# Patient Record
Sex: Female | Born: 1980
Health system: Southern US, Community
[De-identification: ages and names within clinical notes are randomized; demographics above are authoritative.]

## PROBLEM LIST (undated history)

## (undated) ENCOUNTER — Ambulatory Visit

## (undated) ENCOUNTER — Encounter

## (undated) ENCOUNTER — Ambulatory Visit: Payer: PRIVATE HEALTH INSURANCE | Attending: Physician Assistant | Primary: Physician Assistant

## (undated) ENCOUNTER — Ambulatory Visit: Payer: Medicaid (Managed Care) | Attending: Medical | Primary: Medical

## (undated) ENCOUNTER — Encounter: Attending: Podiatrist | Primary: Podiatrist

## (undated) ENCOUNTER — Ambulatory Visit: Attending: Pharmacist | Primary: Pharmacist

## (undated) ENCOUNTER — Telehealth

## (undated) ENCOUNTER — Ambulatory Visit: Payer: PRIVATE HEALTH INSURANCE | Attending: Hematology & Oncology | Primary: Hematology & Oncology

## (undated) ENCOUNTER — Encounter: Payer: PRIVATE HEALTH INSURANCE | Attending: Women's Health | Primary: Women's Health

## (undated) ENCOUNTER — Ambulatory Visit: Payer: Medicaid (Managed Care) | Attending: Critical Care Medicine | Primary: Critical Care Medicine

## (undated) ENCOUNTER — Telehealth: Attending: Medical | Primary: Medical

## (undated) ENCOUNTER — Ambulatory Visit
Payer: PRIVATE HEALTH INSURANCE | Attending: Physical Medicine & Rehabilitation | Primary: Physical Medicine & Rehabilitation

## (undated) ENCOUNTER — Ambulatory Visit: Payer: PRIVATE HEALTH INSURANCE

## (undated) ENCOUNTER — Encounter: Attending: Women's Health | Primary: Women's Health

## (undated) ENCOUNTER — Ambulatory Visit: Payer: Medicaid (Managed Care) | Attending: Family | Primary: Family

## (undated) ENCOUNTER — Ambulatory Visit: Payer: Medicaid (Managed Care) | Attending: Family Medicine | Primary: Family Medicine

## (undated) ENCOUNTER — Encounter: Attending: Family | Primary: Family

## (undated) ENCOUNTER — Ambulatory Visit: Payer: Medicaid (Managed Care) | Attending: Podiatrist | Primary: Podiatrist

## (undated) ENCOUNTER — Encounter: Attending: Physician Assistant | Primary: Physician Assistant

## (undated) ENCOUNTER — Telehealth: Attending: Family | Primary: Family

## (undated) ENCOUNTER — Encounter: Attending: Medical | Primary: Medical

## (undated) ENCOUNTER — Encounter
Attending: Student in an Organized Health Care Education/Training Program | Primary: Student in an Organized Health Care Education/Training Program

## (undated) ENCOUNTER — Ambulatory Visit: Payer: Medicaid (Managed Care)

## (undated) ENCOUNTER — Encounter: Attending: Internal Medicine | Primary: Internal Medicine

## (undated) ENCOUNTER — Ambulatory Visit: Payer: PRIVATE HEALTH INSURANCE | Attending: Internal Medicine | Primary: Internal Medicine

## (undated) ENCOUNTER — Telehealth: Payer: PRIVATE HEALTH INSURANCE

## (undated) ENCOUNTER — Encounter
Payer: Medicaid (Managed Care) | Attending: Student in an Organized Health Care Education/Training Program | Primary: Student in an Organized Health Care Education/Training Program

## (undated) ENCOUNTER — Telehealth: Attending: Physician Assistant | Primary: Physician Assistant

## (undated) ENCOUNTER — Encounter: Attending: Physical Medicine & Rehabilitation | Primary: Physical Medicine & Rehabilitation

## (undated) ENCOUNTER — Ambulatory Visit: Payer: Medicaid (Managed Care) | Attending: Internal Medicine | Primary: Internal Medicine

## (undated) ENCOUNTER — Telehealth: Attending: Obstetrics & Gynecology | Primary: Obstetrics & Gynecology

## (undated) ENCOUNTER — Non-Acute Institutional Stay: Payer: PRIVATE HEALTH INSURANCE | Attending: Podiatrist | Primary: Podiatrist

## (undated) ENCOUNTER — Ambulatory Visit: Payer: Medicaid (Managed Care) | Attending: Physician Assistant | Primary: Physician Assistant

## (undated) DIAGNOSIS — E119 Type 2 diabetes mellitus without complications: Secondary | ICD-10-CM

## (undated) DIAGNOSIS — J40 Bronchitis, not specified as acute or chronic: Secondary | ICD-10-CM

## (undated) DIAGNOSIS — G4730 Sleep apnea, unspecified: Secondary | ICD-10-CM

## (undated) DIAGNOSIS — E162 Hypoglycemia, unspecified: Secondary | ICD-10-CM

## (undated) DIAGNOSIS — J45909 Unspecified asthma, uncomplicated: Secondary | ICD-10-CM

## (undated) DIAGNOSIS — K219 Gastro-esophageal reflux disease without esophagitis: Secondary | ICD-10-CM

## (undated) DIAGNOSIS — R0600 Dyspnea, unspecified: Secondary | ICD-10-CM

## (undated) DIAGNOSIS — R0789 Other chest pain: Secondary | ICD-10-CM

## (undated) HISTORY — PX: WISDOM TOOTH EXTRACTION: SHX21

## (undated) HISTORY — PX: CHOLECYSTECTOMY: SHX55

## (undated) HISTORY — PX: CARPAL TUNNEL RELEASE: SHX101

---
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
## 2001-06-13 ENCOUNTER — Encounter: Payer: Self-pay | Admitting: Obstetrics & Gynecology

## 2001-06-13 ENCOUNTER — Inpatient Hospital Stay (HOSPITAL_COMMUNITY): Admission: AD | Admit: 2001-06-13 | Discharge: 2001-06-13 | Payer: Self-pay | Admitting: Obstetrics & Gynecology

## 2001-06-17 ENCOUNTER — Inpatient Hospital Stay (HOSPITAL_COMMUNITY): Admission: AD | Admit: 2001-06-17 | Discharge: 2001-06-17 | Payer: Self-pay | Admitting: Obstetrics & Gynecology

## 2001-08-11 HISTORY — PX: CHOLECYSTECTOMY: SHX55

## 2001-08-20 ENCOUNTER — Emergency Department (HOSPITAL_COMMUNITY): Admission: EM | Admit: 2001-08-20 | Discharge: 2001-08-21 | Payer: Self-pay | Admitting: Emergency Medicine

## 2001-08-20 ENCOUNTER — Encounter: Payer: Self-pay | Admitting: Emergency Medicine

## 2006-06-05 ENCOUNTER — Observation Stay: Payer: Self-pay

## 2006-11-01 ENCOUNTER — Emergency Department: Payer: Self-pay | Admitting: Emergency Medicine

## 2007-06-16 ENCOUNTER — Observation Stay: Payer: Self-pay

## 2007-10-16 ENCOUNTER — Emergency Department: Payer: Self-pay | Admitting: Emergency Medicine

## 2007-12-02 ENCOUNTER — Emergency Department: Payer: Self-pay | Admitting: Emergency Medicine

## 2008-02-15 ENCOUNTER — Emergency Department: Payer: Self-pay | Admitting: Emergency Medicine

## 2008-03-11 ENCOUNTER — Emergency Department: Payer: Self-pay | Admitting: Emergency Medicine

## 2008-08-26 ENCOUNTER — Emergency Department: Payer: Self-pay

## 2008-09-12 ENCOUNTER — Emergency Department: Payer: Self-pay | Admitting: Emergency Medicine

## 2009-03-11 ENCOUNTER — Emergency Department: Payer: Self-pay | Admitting: Emergency Medicine

## 2009-04-02 ENCOUNTER — Emergency Department: Payer: Self-pay | Admitting: Emergency Medicine

## 2009-08-05 ENCOUNTER — Observation Stay: Payer: Self-pay | Admitting: Obstetrics and Gynecology

## 2009-08-27 ENCOUNTER — Encounter: Payer: Self-pay | Admitting: Family Medicine

## 2009-09-11 ENCOUNTER — Encounter: Payer: Self-pay | Admitting: Family Medicine

## 2009-10-09 ENCOUNTER — Encounter: Payer: Self-pay | Admitting: Family Medicine

## 2009-11-12 ENCOUNTER — Ambulatory Visit: Payer: Self-pay | Admitting: Pediatrics

## 2009-12-14 ENCOUNTER — Ambulatory Visit: Payer: Self-pay | Admitting: Family Medicine

## 2010-02-19 ENCOUNTER — Ambulatory Visit: Payer: Self-pay | Admitting: Family Medicine

## 2011-06-20 ENCOUNTER — Emergency Department: Payer: Self-pay | Admitting: Emergency Medicine

## 2011-07-17 ENCOUNTER — Emergency Department: Payer: Self-pay | Admitting: Emergency Medicine

## 2011-11-03 ENCOUNTER — Emergency Department: Payer: Self-pay

## 2011-11-03 LAB — URINALYSIS, COMPLETE
Glucose,UR: NEGATIVE mg/dL (ref 0–75)
Leukocyte Esterase: NEGATIVE
Nitrite: NEGATIVE
Ph: 6 (ref 4.5–8.0)
Protein: 30
RBC,UR: 8 /HPF (ref 0–5)
Specific Gravity: 1.035 (ref 1.003–1.030)
Squamous Epithelial: 2
WBC UR: 1 /HPF (ref 0–5)

## 2011-11-03 LAB — PREGNANCY, URINE: Pregnancy Test, Urine: NEGATIVE m[IU]/mL

## 2012-08-08 ENCOUNTER — Emergency Department: Payer: Self-pay | Admitting: Internal Medicine

## 2012-09-27 ENCOUNTER — Emergency Department: Payer: Self-pay | Admitting: Emergency Medicine

## 2012-09-27 LAB — URINALYSIS, COMPLETE
Bilirubin,UR: NEGATIVE
Nitrite: NEGATIVE
Ph: 5 (ref 4.5–8.0)
Protein: NEGATIVE
RBC,UR: 9 /HPF (ref 0–5)
Squamous Epithelial: 10
WBC UR: 18 /HPF (ref 0–5)

## 2012-12-12 ENCOUNTER — Emergency Department: Payer: Self-pay | Admitting: Internal Medicine

## 2012-12-12 LAB — COMPREHENSIVE METABOLIC PANEL
Alkaline Phosphatase: 94 U/L (ref 50–136)
Anion Gap: 7 (ref 7–16)
BUN: 11 mg/dL (ref 7–18)
Calcium, Total: 9 mg/dL (ref 8.5–10.1)
Creatinine: 0.78 mg/dL (ref 0.60–1.30)
EGFR (African American): >60
EGFR (Non-African Amer.): >60
Glucose: 128 mg/dL — ABNORMAL HIGH (ref 65–99)
Osmolality: 282 (ref 275–301)
Potassium: 3.8 mmol/L (ref 3.5–5.1)

## 2012-12-12 LAB — URINALYSIS, COMPLETE
Glucose,UR: NEGATIVE mg/dL (ref 0–75)
Nitrite: NEGATIVE
Ph: 5 (ref 4.5–8.0)
Protein: 30
Specific Gravity: 1.047 (ref 1.003–1.030)
WBC UR: 73 /HPF (ref 0–5)

## 2012-12-12 LAB — CBC
HCT: 39.2 % (ref 35.0–47.0)
MCH: 27 pg (ref 26.0–34.0)
MCHC: 33.1 g/dL (ref 32.0–36.0)
Platelet: 289 10*3/uL (ref 150–440)
RBC: 4.82 10*6/uL (ref 3.80–5.20)
WBC: 13.6 10*3/uL — ABNORMAL HIGH (ref 3.6–11.0)

## 2012-12-12 LAB — LIPASE, BLOOD: Lipase: 170 U/L (ref 73–393)

## 2013-02-17 ENCOUNTER — Emergency Department: Payer: Self-pay | Admitting: Emergency Medicine

## 2013-02-17 LAB — URINALYSIS, COMPLETE
Bilirubin,UR: NEGATIVE
Glucose,UR: NEGATIVE mg/dL (ref 0–75)
Ketone: NEGATIVE
Nitrite: NEGATIVE
Ph: 6 (ref 4.5–8.0)
Protein: NEGATIVE
RBC,UR: 16 /HPF (ref 0–5)
Specific Gravity: 1.03 (ref 1.003–1.030)
Squamous Epithelial: 19
WBC UR: 22 /HPF (ref 0–5)

## 2013-04-08 ENCOUNTER — Emergency Department: Payer: Self-pay | Admitting: Emergency Medicine

## 2013-04-08 LAB — URINALYSIS, COMPLETE
Glucose,UR: NEGATIVE mg/dL (ref 0–75)
Nitrite: NEGATIVE
Ph: 5 (ref 4.5–8.0)
Protein: NEGATIVE
RBC,UR: 7 /HPF (ref 0–5)
Specific Gravity: 1.024 (ref 1.003–1.030)
Squamous Epithelial: 15
WBC UR: 12 /HPF (ref 0–5)

## 2013-04-08 LAB — COMPREHENSIVE METABOLIC PANEL
Albumin: 3.2 g/dL — ABNORMAL LOW (ref 3.4–5.0)
Alkaline Phosphatase: 101 U/L (ref 50–136)
Anion Gap: 7 (ref 7–16)
BUN: 8 mg/dL (ref 7–18)
Bilirubin,Total: 0.4 mg/dL (ref 0.2–1.0)
Calcium, Total: 9.5 mg/dL (ref 8.5–10.1)
Chloride: 105 mmol/L (ref 98–107)
Co2: 25 mmol/L (ref 21–32)
Creatinine: 0.68 mg/dL (ref 0.60–1.30)
EGFR (African American): >60
EGFR (Non-African Amer.): >60
Glucose: 78 mg/dL (ref 65–99)
Osmolality: 271 (ref 275–301)
Potassium: 4.1 mmol/L (ref 3.5–5.1)
SGOT(AST): 19 U/L (ref 15–37)
SGPT (ALT): 24 U/L (ref 12–78)
Sodium: 137 mmol/L (ref 136–145)
Total Protein: 7.1 g/dL (ref 6.4–8.2)

## 2013-04-08 LAB — CBC
HCT: 40.2 % (ref 35.0–47.0)
HGB: 13.6 g/dL (ref 12.0–16.0)
MCH: 28.4 pg (ref 26.0–34.0)
MCHC: 33.9 g/dL (ref 32.0–36.0)
MCV: 84 fL (ref 80–100)
Platelet: 263 10*3/uL (ref 150–440)
RBC: 4.8 10*6/uL (ref 3.80–5.20)
RDW: 15.7 % — ABNORMAL HIGH (ref 11.5–14.5)
WBC: 12.4 10*3/uL — ABNORMAL HIGH (ref 3.6–11.0)

## 2013-04-08 LAB — PREGNANCY, URINE: Pregnancy Test, Urine: POSITIVE m[IU]/mL

## 2013-05-22 ENCOUNTER — Emergency Department: Payer: Self-pay | Admitting: Emergency Medicine

## 2013-05-22 LAB — CBC
HCT: 35.2 % (ref 35.0–47.0)
MCH: 28.9 pg (ref 26.0–34.0)
MCHC: 34.4 g/dL (ref 32.0–36.0)
MCV: 84 fL (ref 80–100)
Platelet: 211 10*3/uL (ref 150–440)
RBC: 4.19 10*6/uL (ref 3.80–5.20)
RDW: 15.3 % — ABNORMAL HIGH (ref 11.5–14.5)
WBC: 10.8 10*3/uL (ref 3.6–11.0)

## 2013-05-22 LAB — URINALYSIS, COMPLETE
Bilirubin,UR: NEGATIVE
Ketone: NEGATIVE
Ph: 5 (ref 4.5–8.0)
RBC,UR: 8 /HPF (ref 0–5)
Squamous Epithelial: 3
WBC UR: 10 /HPF (ref 0–5)

## 2013-05-22 LAB — HCG, QUANTITATIVE, PREGNANCY: Beta Hcg, Quant.: 20929 m[IU]/mL — ABNORMAL HIGH

## 2013-05-22 LAB — WET PREP, GENITAL

## 2013-07-14 ENCOUNTER — Emergency Department: Payer: Self-pay | Admitting: Emergency Medicine

## 2013-07-14 LAB — COMPREHENSIVE METABOLIC PANEL
Albumin: 2.5 g/dL — ABNORMAL LOW (ref 3.4–5.0)
Alkaline Phosphatase: 115 U/L
BUN: 6 mg/dL — ABNORMAL LOW (ref 7–18)
Bilirubin,Total: 0.3 mg/dL (ref 0.2–1.0)
Chloride: 108 mmol/L — ABNORMAL HIGH (ref 98–107)
Creatinine: 0.52 mg/dL — ABNORMAL LOW (ref 0.60–1.30)
EGFR (African American): >60
Glucose: 127 mg/dL — ABNORMAL HIGH (ref 65–99)
Osmolality: 271 (ref 275–301)

## 2013-07-14 LAB — URINALYSIS, COMPLETE
Blood: NEGATIVE
Nitrite: NEGATIVE
Protein: 100
Squamous Epithelial: 6
WBC UR: 5 /HPF (ref 0–5)

## 2013-07-14 LAB — CBC
HGB: 12 g/dL (ref 12.0–16.0)
MCH: 28.7 pg (ref 26.0–34.0)
MCV: 84 fL (ref 80–100)
Platelet: 276 10*3/uL (ref 150–440)
RBC: 4.18 10*6/uL (ref 3.80–5.20)
RDW: 14.5 % (ref 11.5–14.5)

## 2013-07-14 LAB — TROPONIN I: Troponin-I: <0.02 ng/mL

## 2014-02-13 ENCOUNTER — Emergency Department: Payer: Self-pay | Admitting: Emergency Medicine

## 2014-08-12 ENCOUNTER — Emergency Department: Payer: Self-pay | Admitting: Emergency Medicine

## 2014-11-29 ENCOUNTER — Emergency Department
Admit: 2014-11-29 | Disposition: A | Discharge status: Home / Self Care | Payer: Self-pay | Admitting: Emergency Medicine

## 2015-01-10 ENCOUNTER — Emergency Department
Admission: EM | Admit: 2015-01-10 | Discharge: 2015-01-10 | Disposition: A | Discharge status: Home / Self Care | Payer: Medicaid Other | Attending: Emergency Medicine | Admitting: Emergency Medicine

## 2015-01-10 ENCOUNTER — Encounter: Payer: Self-pay | Admitting: Emergency Medicine

## 2015-01-10 ENCOUNTER — Emergency Department: Payer: Medicaid Other

## 2015-01-10 DIAGNOSIS — M546 Pain in thoracic spine: Secondary | ICD-10-CM | POA: Diagnosis not present

## 2015-01-10 DIAGNOSIS — J9801 Acute bronchospasm: Secondary | ICD-10-CM

## 2015-01-10 DIAGNOSIS — Z720 Tobacco use: Secondary | ICD-10-CM | POA: Diagnosis not present

## 2015-01-10 DIAGNOSIS — R0602 Shortness of breath: Secondary | ICD-10-CM | POA: Diagnosis present

## 2015-01-10 DIAGNOSIS — J45901 Unspecified asthma with (acute) exacerbation: Secondary | ICD-10-CM | POA: Diagnosis not present

## 2015-01-10 DIAGNOSIS — Z79899 Other long term (current) drug therapy: Secondary | ICD-10-CM | POA: Insufficient documentation

## 2015-01-10 HISTORY — DX: Unspecified asthma, uncomplicated: J45.909

## 2015-01-10 HISTORY — DX: Gastro-esophageal reflux disease without esophagitis: K21.9

## 2015-01-10 HISTORY — DX: Bronchitis, not specified as acute or chronic: J40

## 2015-01-10 MED ORDER — ALBUTEROL SULFATE HFA 108 (90 BASE) MCG/ACT IN AERS: 2.0000 | INHALATION_SPRAY | RESPIRATORY_TRACT | Status: DC | PRN

## 2015-01-10 MED ORDER — DEXAMETHASONE 1 MG/ML PO CONC
ORAL | Status: AC
  Administered 2015-01-10: 10 mg via ORAL
  Filled 2015-01-10: qty 1

## 2015-01-10 MED ORDER — DEXAMETHASONE 4 MG PO TABS
10.0000 mg | ORAL_TABLET | Freq: Once | ORAL | Status: DC
  Filled 2015-01-10: qty 1

## 2015-01-10 MED ORDER — DEXAMETHASONE 1 MG/ML PO CONC
10.0000 mg | Freq: Once | ORAL | Status: AC
  Administered 2015-01-10: 10 mg via ORAL

## 2015-01-10 MED ORDER — DEXAMETHASONE 1 MG/ML PO CONC: 10.0000 mg | Freq: Once | ORAL | Status: DC

## 2015-01-10 NOTE — ED Notes (Signed)
Pt with shortness of breath and upper back pain for one month. States is coughing up green sputum. No resp distress.

## 2015-01-10 NOTE — ED Provider Notes (Signed)
[facility] Emergency Department Provider Note  ____________________________________________  Time seen: 5:10 PM  I have reviewed the triage vital signs and the nursing notes.   HISTORY  Chief Complaint Shortness of Breath    HPI Teresa Malone is a 34 y.o. female who complains of shortness of breath and productive cough for 2 days. No fever or chills, nausea vomiting diarrhea, chest pain. She notes that she has gone 7 years of working and just started working about one month ago and has found that she has diffuse achy joints with the working. She works in a [employer]. 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 She notes that she has requested a face mask to wear while working but has not been provided one by her employer.     Past Medical History  Diagnosis Date  . Asthma   . GERD (gastroesophageal reflux disease)   . Bronchitis     There are no active problems to display for this patient.   Past Surgical History  Procedure Laterality Date  . Cholecystectomy      Current Outpatient Rx  Name  Route  Sig  Dispense  Refill  . ALBUTEROL SULFATE HFA IN   Inhalation   Inhale 1 puff into the lungs 2 (two) times daily as needed (shortness of breath or wheezing).         . cetirizine (ZYRTEC) 10 MG tablet   Oral   Take 10 mg by mouth daily.         Marland Kitchen omeprazole (PRILOSEC) 40 MG capsule   Oral   Take 40 mg by mouth daily.         Marland Kitchen albuterol (PROVENTIL HFA) 108 (90 BASE) MCG/ACT inhaler   Inhalation   Inhale 2 puffs into the lungs every 4 (four) hours as needed for wheezing or shortness of breath.   1 Inhaler   0     Allergies Aspirin; Meloxicam; Morphine and related; and Tramadol  No family history on file.  Social History History  Substance Use Topics  . Smoking status: Current Every Day Smoker  . Smokeless tobacco: Not on file  . Alcohol  Use: No    Review of Systems  Constitutional: No fever or chills. No weight changes Eyes:No blurry vision or double vision.  ENT: No sore throat. Cardiovascular: No chest pain. Respiratory: Shortness of breath and productive cough. Gastrointestinal: Negative for abdominal pain, vomiting and diarrhea.  No BRBPR or melena. Genitourinary: Negative for dysuria, urinary retention, bloody urine, or difficulty urinating. Musculoskeletal: Negative for back pain. No joint swelling or pain. Skin: Negative for rash. Neurological: Negative for headaches, focal weakness or numbness. Psychiatric:No anxiety or depression.   Endocrine:No hot/cold intolerance, changes in energy, or sleep difficulty.  10-point ROS otherwise negative.  ____________________________________________   PHYSICAL EXAM:  VITAL SIGNS: ED Triage Vitals  Enc Vitals Group     BP 01/10/15 1557 121/56 mmHg     Pulse Rate 01/10/15 1557 82     Resp 01/10/15 1557 20     Temp 01/10/15 1557 97.6 F (36.4 C)     Temp Source 01/10/15 1557 Oral     SpO2 01/10/15 1557 99 %     Weight 01/10/15 1557 318 lb (144.244 kg)     Height 01/10/15 1557 5\' 6"  (1.676 m)     Head Cir --      Peak  Flow --      Pain Score 01/10/15 1558 6     Pain Loc --      Pain Edu? --      Excl. in [place]? --      Constitutional: Alert and oriented. Well appearing and in no distress. Eyes: No scleral icterus. No conjunctival pallor. PERRL. EOMI ENT   Head: Normocephalic and atraumatic.   Nose: No congestion/rhinnorhea. No septal hematoma   Mouth/Throat: MMM, no pharyngeal erythema. No peritonsillar mass. No uvula shift.   Neck: No stridor. No SubQ emphysema. No meningismus. Hematological/Lymphatic/Immunilogical: No cervical lymphadenopathy. Cardiovascular: RRR. Normal and symmetric distal pulses are present in all extremities. No murmurs, rubs, or gallops. Respiratory: Normal respiratory effort without tachypnea nor retractions. Breath  sounds are clear and equal bilaterally. No wheezes/rales/rhonchi. With forced expiration or forceful cough, there is wheezing on auscultation Gastrointestinal: Soft and nontender. No distention. There is no CVA tenderness.  No rebound, rigidity, or guarding. Genitourinary: deferred Musculoskeletal: Nontender with normal range of motion in all extremities. No joint effusions.  No lower extremity tenderness.  No edema. Neurologic:   Normal speech and language.  CN 2-10 normal. Motor grossly intact. No pronator drift.  Normal gait. No gross focal neurologic deficits are appreciated.  Skin:  Skin is warm, dry and intact. No rash noted.  No petechiae, purpura, or bullae. Psychiatric: Mood and affect are normal. Speech and behavior are normal. Patient exhibits appropriate insight and judgment.  ____________________________________________    LABS (pertinent positives/negatives) (all labs ordered are listed, but only abnormal results are displayed) Labs Reviewed - No data to display ____________________________________________   EKG    ____________________________________________    RADIOLOGY  Chest x-ray unremarkable  ____________________________________________   PROCEDURES  ____________________________________________   INITIAL IMPRESSION / ASSESSMENT AND PLAN / ED COURSE  Pertinent labs & imaging results that were available during my care of the patient were reviewed by me and considered in my medical decision making (see chart for details).  Patient presents with acute bronchospasm in the setting of a history of asthma, most likely related to new environmental exposure in her job. I did advise her that wearing a facemask or working will probably be helpful and protecting her from the symptoms. We'll give her a one-time dose of Decadron here as well as a new prescription for albuterol she is recently run out. No evidence of pneumonia or sepsis, PE TAD carditis ACS  mediastinitis or pneumothorax. We'll discharge her home in good condition to follow up with her primary care doctor  ____________________________________________   FINAL CLINICAL IMPRESSION(S) / ED DIAGNOSES  Final diagnoses:  Bronchospasm, acute      Carrie Mew, MD 01/10/15 1745

## 2015-01-10 NOTE — Discharge Instructions (Signed)
Bronchospasm °A bronchospasm is a spasm or tightening of the airways going into the lungs. During a bronchospasm breathing becomes more difficult because the airways get smaller. When this happens there can be coughing, a whistling sound when breathing (wheezing), and difficulty breathing. Bronchospasm is often associated with asthma, but not all patients who experience a bronchospasm have asthma. °CAUSES  °A bronchospasm is caused by inflammation or irritation of the airways. The inflammation or irritation may be triggered by:  °· Allergies (such as to animals, pollen, food, or mold). Allergens that cause bronchospasm may cause wheezing immediately after exposure or many hours later.   °· Infection. Viral infections are believed to be the most common cause of bronchospasm.   °· Exercise.   °· Irritants (such as pollution, cigarette smoke, strong odors, aerosol sprays, and paint fumes).   °· Weather changes. Winds increase molds and pollens in the air. Rain refreshes the air by washing irritants out. Cold air may cause inflammation.   °· Stress and emotional upset.   °SIGNS AND SYMPTOMS  °· Wheezing.   °· Excessive nighttime coughing.   °· Frequent or severe coughing with a simple cold.   °· Chest tightness.   °· Shortness of breath.   °DIAGNOSIS  °Bronchospasm is usually diagnosed through a history and physical exam. Tests, such as chest X-rays, are sometimes done to look for other conditions. °TREATMENT  °· Inhaled medicines can be given to open up your airways and help you breathe. The medicines can be given using either an inhaler or a nebulizer machine. °· Corticosteroid medicines may be given for severe bronchospasm, usually when it is associated with asthma. °HOME CARE INSTRUCTIONS  °· Always have a plan prepared for seeking medical care. Know when to call your health care provider and local emergency services (911 in the [place] Know where you can access local emergency care. °· Only take medicines as  directed by your health care provider. °· If you were prescribed an inhaler or nebulizer machine, ask your health care provider to explain how to use it correctly. Always use a spacer with your inhaler if you were given one. °· It is necessary to remain calm during an attack. Try to relax and breathe more slowly.  °· Control your home environment in the following ways:   °¨ Change your heating and air conditioning filter at least once a month.   °¨ Limit your use of fireplaces and wood stoves. °¨ Do not smoke and do not allow smoking in your home.   °¨ Avoid exposure to perfumes and fragrances.   °¨ Get rid of pests (such as roaches and mice) and their droppings.   °¨ Throw away plants if you see mold on them.   °¨ Keep your house clean and dust free.   °¨ Replace carpet with wood, tile, or vinyl flooring. Carpet can trap dander and dust.   °¨ Use allergy-proof pillows, mattress covers, and box spring covers.   °¨ Wash bed sheets and blankets every week in hot water and dry them in a dryer.   °¨ Use blankets that are made of polyester or cotton.   °¨ Wash hands frequently. °SEEK MEDICAL CARE IF:  °· You have muscle aches.   °· You have chest pain.   °· The sputum changes from clear or white to yellow, green, gray, or bloody.   °· The sputum you cough up gets thicker.   °· There are problems that may be related to the medicine you are given, such as a rash, itching, swelling, or trouble breathing.   °SEEK IMMEDIATE MEDICAL CARE IF:  °· You have worsening wheezing and coughing even   after taking your prescribed medicines.   °· You have increased difficulty breathing.   °· You develop severe chest pain. °MAKE SURE YOU:  °· Understand these instructions. °· Will watch your condition. °· Will get help right away if you are not doing well or get worse. °Document Released: 07/31/2003 Document Revised: 08/02/2013 Document Reviewed: 01/17/2013 °[employer]® Patient Information ©2015 [employer]. This information is not  intended to replace advice given to you by your health care provider. Make sure you discuss any questions you have with your health care provider. ° °

## 2015-01-10 NOTE — ED Notes (Signed)
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 and c/o low sternal chest wall pain radiating around to the back. Patient alert and oriented, not complaining of any other system

## 2015-01-21 DIAGNOSIS — Z720 Tobacco use: Secondary | ICD-10-CM | POA: Insufficient documentation

## 2015-01-21 DIAGNOSIS — J45901 Unspecified asthma with (acute) exacerbation: Secondary | ICD-10-CM | POA: Insufficient documentation

## 2015-01-21 DIAGNOSIS — J0140 Acute pansinusitis, unspecified: Secondary | ICD-10-CM | POA: Insufficient documentation

## 2015-01-21 DIAGNOSIS — H9203 Otalgia, bilateral: Secondary | ICD-10-CM | POA: Insufficient documentation

## 2015-01-21 DIAGNOSIS — Z79899 Other long term (current) drug therapy: Secondary | ICD-10-CM | POA: Diagnosis not present

## 2015-01-21 NOTE — ED Notes (Signed)
Pt states that she went swimming yesterday and didn't wear earplugs, pt states that she started with ear pain today bilat and ran a fever earlier

## 2015-01-22 ENCOUNTER — Emergency Department
Admission: EM | Admit: 2015-01-22 | Discharge: 2015-01-22 | Disposition: A | Discharge status: Home / Self Care | Payer: Medicaid Other | Attending: Emergency Medicine | Admitting: Emergency Medicine

## 2015-01-22 DIAGNOSIS — J0140 Acute pansinusitis, unspecified: Secondary | ICD-10-CM

## 2015-01-22 DIAGNOSIS — H9203 Otalgia, bilateral: Secondary | ICD-10-CM

## 2015-01-22 DIAGNOSIS — J029 Acute pharyngitis, unspecified: Secondary | ICD-10-CM

## 2015-01-22 LAB — POCT RAPID STREP A: Streptococcus, Group A Screen (Direct): NEGATIVE

## 2015-01-22 MED ORDER — FLUTICASONE PROPIONATE 50 MCG/ACT NA SUSP: 1.0000 | Freq: Every day | NASAL | Status: DC

## 2015-01-22 MED ORDER — PSEUDOEPHEDRINE HCL 30 MG PO TABS: 30.0000 mg | ORAL_TABLET | Freq: Four times a day (QID) | ORAL | Status: DC | PRN

## 2015-01-22 NOTE — ED Provider Notes (Signed)
[facility] Emergency Department Provider Note  ____________________________________________  Time seen: Approximately 650 AM  I have reviewed the triage vital signs and the nursing notes.   HISTORY  Chief Complaint Otalgia    HPI Teresa Malone is a 34 y.o. female who comes in with ear pain, throat pain and sinus pain. The patient reports that she went swimming over the weekend and yesterday developed the symptoms of the ear pain and throat pain and facial pain. The patient reports that she's had elevated temperatures up to 102. She reports that she has been taking Tylenol and ibuprofen for pain and fever but it has not been helping. She is unsure she's had any drainage from her ear but reports that she's had some blood and mucus from her sinuses. The patient reports that it has been green appearing mucus. She reports that the pain is a 6-7 out of 10 in intensity and is worse this swallow. The patient reports that she has not eaten in 2 days. Given these symptoms the patient decided to come into the hospital for further evaluation and treatment.   Past Medical History  Diagnosis Date  . Asthma   . GERD (gastroesophageal reflux disease)   . Bronchitis     There are no active problems to display for this patient.   Past Surgical History  Procedure Laterality Date  . Cholecystectomy      Current Outpatient Rx  Name  Route  Sig  Dispense  Refill  . albuterol (PROVENTIL HFA) 108 (90 BASE) MCG/ACT inhaler   Inhalation   Inhale 2 puffs into the lungs every 4 (four) hours as needed for wheezing or shortness of breath.   1 Inhaler   0   . ALBUTEROL SULFATE HFA IN   Inhalation   Inhale 1 puff into the lungs 2 (two) times daily as needed (shortness of breath or wheezing).         . cetirizine (ZYRTEC) 10 MG tablet   Oral   Take 10 mg by mouth daily.         . fluticasone (FLONASE) 50 MCG/ACT nasal spray   Each Nare   Place 1 spray into both  nostrils daily.   16 g   0   . omeprazole (PRILOSEC) 40 MG capsule   Oral   Take 40 mg by mouth daily.         . pseudoephedrine (SUDAFED) 30 MG tablet   Oral   Take 1 tablet (30 mg total) by mouth every 6 (six) hours as needed for congestion.   24 tablet   0     Allergies Aspirin; Meloxicam; Morphine and related; and Tramadol  No family history on file.  Social History History  Substance Use Topics  . Smoking status: Current Every Day Smoker  . Smokeless tobacco: Not on file  . Alcohol Use: No    Review of Systems Constitutional: Fever Eyes: No visual changes. ENT: sore throat. Cardiovascular: Denies chest pain. Respiratory:shortness of breath. Gastrointestinal: No abdominal pain.  No nausea, no vomiting.   Genitourinary: Negative for dysuria. Musculoskeletal: back pain. Skin: Negative for rash. Neurological: Headache  10-point ROS otherwise negative.  ____________________________________________   PHYSICAL EXAM:  VITAL SIGNS: ED Triage Vitals  Enc Vitals Group     BP 01/21/15 2332 143/64 mmHg     Pulse Rate 01/21/15 2332 104     Resp --      Temp 01/21/15 2332 98.3 F (36.8 C)  Temp Source 01/21/15 2332 Oral     SpO2 01/21/15 2332 100 %     Weight 01/21/15 2332 317 lb (143.79 kg)     Height 01/21/15 2332 5\' 6"  (1.676 m)     Head Cir --      Peak Flow --      Pain Score 01/21/15 2335 6     Pain Loc --      Pain Edu? --      Excl. in [place]? --     Constitutional: Alert and oriented. Well appearing and in mild distress. Eyes: Conjunctivae are normal. PERRL. EOMI. Head: Atraumatic. Sinuses tender to palpation, TMs without erythema canals are patent without erythema. Nose: No congestion/rhinnorhea. Mouth/Throat: Mucous membranes are moist.  Oropharynx non-erythematous. Neck: No C-spine lymphadenopathy to palpation Cardiovascular: Normal rate, regular rhythm. Grossly normal heart sounds.  Good peripheral circulation. Respiratory: Normal  respiratory effort.  No retractions. Lungs CTAB. Gastrointestinal: Soft and nontender. No distention. No abdominal bruits. No CVA tenderness. Genitourinary: Deferred Musculoskeletal: No lower extremity tenderness nor edema.  No joint effusions. Neurologic:  Normal speech and language. No gross focal neurologic deficits are appreciated.  Skin:  Skin is warm, dry and intact. No rash noted. Psychiatric: Mood and affect are normal.   ____________________________________________   LABS (all labs ordered are listed, but only abnormal results are displayed)  Labs Reviewed  CULTURE, GROUP A STREP ([facility] ONLY)  POCT RAPID STREP A   ____________________________________________  EKG  None ____________________________________________  RADIOLOGY  None ____________________________________________   PROCEDURES  Procedure(s) performed: None  Critical Care performed: No  ____________________________________________   INITIAL IMPRESSION / ASSESSMENT AND PLAN / ED COURSE  Pertinent labs & imaging results that were available during my care of the patient were reviewed by me and considered in my medical decision making (see chart for details).  This is a 34 year old female who comes in with some ear pain and facial pain as well as sore throat. The patient reports she was concerned about swimmers ear but her ears are nonerythematous the canals are patent without significant erythema. I will perform a swab of her throat to evaluate for strep throat and give the patient some nasal steroid for home to help with the symptoms.  The patient's strep test is unremarkable. As the patient's ears do not show any sign of infection the patient likely has a viral sinusitis given the short course of the symptoms. I will discharge the patient home with some Flonase and Sudafed and have her follow-up with her primary care physician for further evaluation. The patient should continue Tylenol and ibuprofen for  her symptoms. ____________________________________________   FINAL CLINICAL IMPRESSION(S) / ED DIAGNOSES  Final diagnoses:  Acute pansinusitis, recurrence not specified  Otalgia, bilateral  Pharyngitis      Loney Hering, MD 01/22/15 [phone]

## 2015-01-22 NOTE — Discharge Instructions (Signed)
Otalgia °The most common reason for this in children is an infection of the middle ear. Pain from the middle ear is usually caused by a build-up of fluid and pressure behind the eardrum. Pain from an earache can be sharp, dull, or burning. The pain may be temporary or constant. The middle ear is connected to the nasal passages by a short narrow tube called the Eustachian tube. The Eustachian tube allows fluid to drain out of the middle ear, and helps keep the pressure in your ear equalized. °CAUSES  °A cold or allergy can block the Eustachian tube with inflammation and the build-up of secretions. This is especially likely in small children, because their Eustachian tube is shorter and more horizontal. When the Eustachian tube closes, the normal flow of fluid from the middle ear is stopped. Fluid can accumulate and cause stuffiness, pain, hearing loss, and an ear infection if germs start growing in this area. °SYMPTOMS  °The symptoms of an ear infection may include fever, ear pain, fussiness, increased crying, and irritability. Many children will have temporary and minor hearing loss during and right after an ear infection. Permanent hearing loss is rare, but the risk increases the more infections a child has. Other causes of ear pain include retained water in the outer ear canal from swimming and bathing. °Ear pain in adults is less likely to be from an ear infection. Ear pain may be referred from other locations. Referred pain may be from the joint between your jaw and the skull. It may also come from a tooth problem or problems in the neck. Other causes of ear pain include: °· A foreign body in the ear. °· Outer ear infection. °· Sinus infections. °· Impacted ear wax. °· Ear injury. °· Arthritis of the jaw or TMJ problems. °· Middle ear infection. °· Tooth infections. °· Sore throat with pain to the ears. °DIAGNOSIS  °Your caregiver can usually make the diagnosis by examining you. Sometimes other special studies,  including x-rays and lab work may be necessary. °TREATMENT  °· If antibiotics were prescribed, use them as directed and finish them even if you or your child's symptoms seem to be improved. °· Sometimes PE tubes are needed in children. These are little plastic tubes which are put into the eardrum during a simple surgical procedure. They allow fluid to drain easier and allow the pressure in the middle ear to equalize. This helps relieve the ear pain caused by pressure changes. °HOME CARE INSTRUCTIONS  °· Only take over-the-counter or prescription medicines for pain, discomfort, or fever as directed by your caregiver. DO NOT GIVE CHILDREN ASPIRIN because of the association of Reye's Syndrome in children taking aspirin. °· Use a cold pack applied to the outer ear for 15-20 minutes, 03-04 times per day or as needed may reduce pain. Do not apply ice directly to the skin. You may cause frost bite. °· Over-the-counter ear drops used as directed may be effective. Your caregiver may sometimes prescribe ear drops. °· Resting in an upright position may help reduce pressure in the middle ear and relieve pain. °· Ear pain caused by rapidly descending from high altitudes can be relieved by swallowing or chewing gum. Allowing infants to suck on a bottle during airplane travel can help. °· Do not smoke in the house or near children. If you are unable to quit smoking, smoke outside. °· Control allergies. °SEEK IMMEDIATE MEDICAL CARE IF:  °· You or your child are becoming sicker. °· Pain or fever   relief is not obtained with medicine.  You or your child's symptoms (pain, fever, or irritability) do not improve within 24 to 48 hours or as instructed.  Severe pain suddenly stops hurting. This may indicate a ruptured eardrum.  You or your children develop new problems such as severe headaches, stiff neck, difficulty swallowing, or swelling of the face or around the ear. Document Released: 03/14/2004 Document Revised: 10/20/2011  Document Reviewed: 07/19/2008 [facility] Patient Information 2015 [place], [place]. This information is not intended to replace advice given to you by your health care provider. Make sure you discuss any questions you have with your health care provider.  Sinusitis Sinusitis is redness, soreness, and inflammation of the paranasal sinuses. Paranasal sinuses are air pockets within the bones of your face (beneath the eyes, the middle of the forehead, or above the eyes). In healthy paranasal sinuses, mucus is able to drain out, and air is able to circulate through them by way of your nose. However, when your paranasal sinuses are inflamed, mucus and air can become trapped. This can allow bacteria and other germs to grow and cause infection. Sinusitis can develop quickly and last only a short time (acute) or continue over a long period (chronic). Sinusitis that lasts for more than 12 weeks is considered chronic.  CAUSES  Causes of sinusitis include:  Allergies.  Structural abnormalities, such as displacement of the cartilage that separates your nostrils (deviated septum), which can decrease the air flow through your nose and sinuses and affect sinus drainage.  Functional abnormalities, such as when the small hairs (cilia) that line your sinuses and help remove mucus do not work properly or are not present. SIGNS AND SYMPTOMS  Symptoms of acute and chronic sinusitis are the same. The primary symptoms are pain and pressure around the affected sinuses. Other symptoms include:  Upper toothache.  Earache.  Headache.  Bad breath.  Decreased sense of smell and taste.  A cough, which worsens when you are lying flat.  Fatigue.  Fever.  Thick drainage from your nose, which often is green and may contain pus (purulent).  Swelling and warmth over the affected sinuses. DIAGNOSIS  Your health care provider will perform a physical exam. During the exam, your health care provider may:  Look in your  nose for signs of abnormal growths in your nostrils (nasal polyps).  Tap over the affected sinus to check for signs of infection.  View the inside of your sinuses (endoscopy) using an imaging device that has a light attached (endoscope). If your health care provider suspects that you have chronic sinusitis, one or more of the following tests may be recommended:  Allergy tests.  Nasal culture. A sample of mucus is taken from your nose, sent to a lab, and screened for bacteria.  Nasal cytology. A sample of mucus is taken from your nose and examined by your health care provider to determine if your sinusitis is related to an allergy. TREATMENT  Most cases of acute sinusitis are related to a viral infection and will resolve on their own within 10 days. Sometimes medicines are prescribed to help relieve symptoms (pain medicine, decongestants, nasal steroid sprays, or saline sprays).  However, for sinusitis related to a bacterial infection, your health care provider will prescribe antibiotic medicines. These are medicines that will help kill the bacteria causing the infection.  Rarely, sinusitis is caused by a fungal infection. In theses cases, your health care provider will prescribe antifungal medicine. For some cases of chronic sinusitis, surgery  is needed. Generally, these are cases in which sinusitis recurs more than 3 times per year, despite other treatments. HOME CARE INSTRUCTIONS   Drink plenty of water. Water helps thin the mucus so your sinuses can drain more easily.  Use a humidifier.  Inhale steam 3 to 4 times a day (for example, sit in the bathroom with the shower running).  Apply a warm, moist washcloth to your face 3 to 4 times a day, or as directed by your health care provider.  Use saline nasal sprays to help moisten and clean your sinuses.  Take medicines only as directed by your health care provider.  If you were prescribed either an antibiotic or antifungal medicine,  finish it all even if you start to feel better. SEEK IMMEDIATE MEDICAL CARE IF:  You have increasing pain or severe headaches.  You have nausea, vomiting, or drowsiness.  You have swelling around your face.  You have vision problems.  You have a stiff neck.  You have difficulty breathing. MAKE SURE YOU:   Understand these instructions.  Will watch your condition.  Will get help right away if you are not doing well or get worse. Document Released: 07/28/2005 Document Revised: 12/12/2013 Document Reviewed: 08/12/2011 [facility] Patient Information 2015 [place], [place]. This information is not intended to replace advice given to you by your health care provider. Make sure you discuss any questions you have with your health care provider.  Pharyngitis Pharyngitis is redness, pain, and swelling (inflammation) of your pharynx.  CAUSES  Pharyngitis is usually caused by infection. Most of the time, these infections are from viruses (viral) and are part of a cold. However, sometimes pharyngitis is caused by bacteria (bacterial). Pharyngitis can also be caused by allergies. Viral pharyngitis may be spread from person to person by coughing, sneezing, and personal items or utensils (cups, forks, spoons, toothbrushes). Bacterial pharyngitis may be spread from person to person by more intimate contact, such as kissing.  SIGNS AND SYMPTOMS  Symptoms of pharyngitis include:   Sore throat.   Tiredness (fatigue).   Low-grade fever.   Headache.  Joint pain and muscle aches.  Skin rashes.  Swollen lymph nodes.  Plaque-like film on throat or tonsils (often seen with bacterial pharyngitis). DIAGNOSIS  Your health care provider will ask you questions about your illness and your symptoms. Your medical history, along with a physical exam, is often all that is needed to diagnose pharyngitis. Sometimes, a rapid strep test is done. Other lab tests may also be done, depending on the suspected  cause.  TREATMENT  Viral pharyngitis will usually get better in 3-4 days without the use of medicine. Bacterial pharyngitis is treated with medicines that kill germs (antibiotics).  HOME CARE INSTRUCTIONS   Drink enough water and fluids to keep your urine clear or pale yellow.   Only take over-the-counter or prescription medicines as directed by your health care provider:   If you are prescribed antibiotics, make sure you finish them even if you start to feel better.   Do not take aspirin.   Get lots of rest.   Gargle with 8 oz of salt water ( tsp of salt per 1 qt of water) as often as every 1-2 hours to soothe your throat.   Throat lozenges (if you are not at risk for choking) or sprays may be used to soothe your throat. SEEK MEDICAL CARE IF:   You have large, tender lumps in your neck.  You have a rash.  You cough  up green, yellow-brown, or bloody spit. SEEK IMMEDIATE MEDICAL CARE IF:   Your neck becomes stiff.  You drool or are unable to swallow liquids.  You vomit or are unable to keep medicines or liquids down.  You have severe pain that does not go away with the use of recommended medicines.  You have trouble breathing (not caused by a stuffy nose). MAKE SURE YOU:   Understand these instructions.  Will watch your condition.  Will get help right away if you are not doing well or get worse. Document Released: 07/28/2005 Document Revised: 05/18/2013 Document Reviewed: 04/04/2013 [facility] Patient Information 2015 [place], [place]. This information is not intended to replace advice given to you by your health care provider. Make sure you discuss any questions you have with your health care provider.

## 2015-01-22 NOTE — ED Notes (Signed)
Pt uprite on stretcher in exam room with no distress noted; reports since Sunday having ear pain, sinus drainage; st went swimming & did not wear earplugs

## 2015-01-25 LAB — CULTURE, GROUP A STREP (THRC)

## 2015-01-29 ENCOUNTER — Emergency Department
Admission: EM | Admit: 2015-01-29 | Discharge: 2015-01-29 | Disposition: A | Discharge status: Home / Self Care | Payer: Medicaid Other | Attending: Emergency Medicine | Admitting: Emergency Medicine

## 2015-01-29 ENCOUNTER — Encounter: Payer: Self-pay | Admitting: Urgent Care

## 2015-01-29 ENCOUNTER — Emergency Department: Payer: Medicaid Other

## 2015-01-29 DIAGNOSIS — Z79899 Other long term (current) drug therapy: Secondary | ICD-10-CM | POA: Diagnosis not present

## 2015-01-29 DIAGNOSIS — Z720 Tobacco use: Secondary | ICD-10-CM | POA: Insufficient documentation

## 2015-01-29 DIAGNOSIS — R05 Cough: Secondary | ICD-10-CM | POA: Diagnosis present

## 2015-01-29 DIAGNOSIS — J45909 Unspecified asthma, uncomplicated: Secondary | ICD-10-CM | POA: Insufficient documentation

## 2015-01-29 DIAGNOSIS — J40 Bronchitis, not specified as acute or chronic: Secondary | ICD-10-CM

## 2015-01-29 DIAGNOSIS — Z7951 Long term (current) use of inhaled steroids: Secondary | ICD-10-CM | POA: Insufficient documentation

## 2015-01-29 MED ORDER — PREDNISONE 10 MG (21) PO TBPK: ORAL_TABLET | ORAL | Status: DC

## 2015-01-29 MED ORDER — DOXYCYCLINE HYCLATE 100 MG PO TABS
ORAL_TABLET | ORAL | Status: AC
  Filled 2015-01-29: qty 1

## 2015-01-29 MED ORDER — DOXYCYCLINE HYCLATE 100 MG PO TABS
100.0000 mg | ORAL_TABLET | Freq: Two times a day (BID) | ORAL | Status: DC
  Administered 2015-01-29: 100 mg via ORAL

## 2015-01-29 MED ORDER — DOXYCYCLINE HYCLATE 100 MG PO CAPS: 100.0000 mg | ORAL_CAPSULE | Freq: Two times a day (BID) | ORAL | Status: DC

## 2015-01-29 MED ORDER — BENZONATATE 200 MG PO CAPS: 200.0000 mg | ORAL_CAPSULE | Freq: Three times a day (TID) | ORAL | Status: DC | PRN

## 2015-01-29 MED ORDER — PREDNISONE 20 MG PO TABS
ORAL_TABLET | ORAL | Status: AC
  Filled 2015-01-29: qty 3

## 2015-01-29 MED ORDER — PREDNISONE 20 MG PO TABS
60.0000 mg | ORAL_TABLET | Freq: Once | ORAL | Status: AC
  Administered 2015-01-29: 60 mg via ORAL

## 2015-01-29 NOTE — ED Notes (Signed)
Spoke with Kerman Passey, MD regarding presenting c/o and triage assessment. MD with [facility] for 2 view CXR only at this time. Order to be entered by this RN.

## 2015-01-29 NOTE — ED Provider Notes (Signed)
[facility] Emergency Department Provider Note  ____________________________________________  Time seen: Approximately 11:22 PM  I have reviewed the triage vital signs and the nursing notes.   HISTORY  Chief Complaint Cough and Pleurisy    HPI Teresa Malone is a 34 y.o. female is here today with cough and chest wall pain says the cough is productive she was seen about a week ago for similar symptoms has not improved seemed to have gotten worse denies fever chills nausea or vomiting states that she tried the decongest and cough medication without much relief and is here today for reevaluation denies any other complaints at this time   Past Medical History  Diagnosis Date  . Asthma   . GERD (gastroesophageal reflux disease)   . Bronchitis     There are no active problems to display for this patient.   Past Surgical History  Procedure Laterality Date  . Cholecystectomy      Current Outpatient Rx  Name  Route  Sig  Dispense  Refill  . albuterol (PROVENTIL HFA) 108 (90 BASE) MCG/ACT inhaler   Inhalation   Inhale 2 puffs into the lungs every 4 (four) hours as needed for wheezing or shortness of breath.   1 Inhaler   0   . ALBUTEROL SULFATE HFA IN   Inhalation   Inhale 1 puff into the lungs 2 (two) times daily as needed (shortness of breath or wheezing).         . benzonatate (TESSALON) 200 MG capsule   Oral   Take 1 capsule (200 mg total) by mouth 3 (three) times daily as needed for cough.   20 capsule   0   . cetirizine (ZYRTEC) 10 MG tablet   Oral   Take 10 mg by mouth daily.         Marland Kitchen doxycycline (VIBRAMYCIN) 100 MG capsule   Oral   Take 1 capsule (100 mg total) by mouth 2 (two) times daily.   20 capsule   0   . fluticasone (FLONASE) 50 MCG/ACT nasal spray   Each Nare   Place 1 spray into both nostrils daily.   16 g   0   . omeprazole (PRILOSEC) 40 MG capsule   Oral   Take 40 mg by mouth daily.         . predniSONE  (STERAPRED UNI-PAK 21 TAB) 10 MG (21) TBPK tablet      Take 6 tablets on day 1 Take 5 tablets on day 2 Take 4 tablets on day 3 Take 3 tablets on day 4 Take 2 tablets on day 5 Take 1 tablet on day 6   21 tablet   0   . pseudoephedrine (SUDAFED) 30 MG tablet   Oral   Take 1 tablet (30 mg total) by mouth every 6 (six) hours as needed for congestion.   24 tablet   0     Allergies Aspirin; Meloxicam; Morphine and related; and Tramadol  No family history on file.  Social History History  Substance Use Topics  . Smoking status: Current Every Day Smoker  . Smokeless tobacco: Not on file  . Alcohol Use: No    Review of Systems Constitutional: No fever/chills Eyes: No visual changes. ENT: No sore throat. Cardiovascular: Denies chest pain. Respiratory: Denies shortness of breath. Gastrointestinal: No abdominal pain.  No nausea, no vomiting.  No diarrhea.  No constipation. Genitourinary: Negative for dysuria. Musculoskeletal: Negative for back pain. Skin: Negative for rash. Neurological: Negative  for headaches, focal weakness or numbness.  10-point ROS otherwise negative.  ____________________________________________   PHYSICAL EXAM:  VITAL SIGNS: ED Triage Vitals  Enc Vitals Group     BP 01/29/15 2220 131/48 mmHg     Pulse Rate 01/29/15 2220 97     Resp 01/29/15 2220 18     Temp 01/29/15 2220 98.6 F (37 C)     Temp Source 01/29/15 2220 Oral     SpO2 01/29/15 2220 98 %     Weight 01/29/15 2220 315 lb (142.883 kg)     Height 01/29/15 2220 5\' 8"  (1.727 m)     Head Cir --      Peak Flow --      Pain Score 01/29/15 2223 5     Pain Loc --      Pain Edu? --      Excl. in [place]? --     Constitutional: Alert and oriented. Well appearing and in no acute distress. Eyes: Conjunctivae are normal. PERRL. EOMI. Head: Atraumatic. Nose: /rhinnorhea. Mouth/Throat: Mucous membranes are moist.  Oropharynx non-erythematous. Neck: No stridor.   Cardiovascular: Normal rate,  regular rhythm. Grossly normal heart sounds.  Good peripheral circulation. Respiratory: Normal respiratory effort.  No retractions. Lungs CTAB.Marland Kitchen Musculoskeletal: No lower extremity tenderness nor edema.  No joint effusions. Neurologic:  Normal speech and language. No gross focal neurologic deficits are appreciated. Speech is normal. No gait instability. Skin:  Skin is warm, dry and intact. No rash noted. Psychiatric: Mood and affect are normal. Speech and behavior are normal.  ____________________________________________     RADIOLOGY  Chest x-ray showed possible mild vascular congestion   PROCEDURES  Procedure(s) performed: None  Critical Care performed: No  ____________________________________________   INITIAL IMPRESSION / ASSESSMENT AND PLAN / ED COURSE  Pertinent labs & imaging results that were available during my care of the patient were reviewed by me and considered in my medical decision making (see chart for details).   Impression in this patient as bronchitis being that she's had this going off and on for 2 weeks Start antibiotic therapy she is given doxycycline in the department as well as prednisone she has an inhaler at home and a prescription for cough medication recommend that she follow-up in one week with her primary care provider Princella Ion if symptoms persist return here otherwise for acute concerns or worsening symptoms ____________________________________________   FINAL CLINICAL IMPRESSION(S) / ED DIAGNOSES  Final diagnoses:  Bronchitis      Aayden Cefalu Verdene Rio, PA-C 01/29/15 [place], [place] 01/30/15 [phone]

## 2015-01-29 NOTE — ED Notes (Signed)
Patient presents with c/o chest wall pain with (+) productive cough. Patient was seen here on 6/12 for the same and was diagnosed with bronchitis and was given steroids; no ABX. Cough has continued. Patient reporting that sputum has a "bad taste and is bloody."

## 2015-04-10 ENCOUNTER — Other Ambulatory Visit: Payer: Self-pay | Admitting: Family Medicine

## 2015-04-10 ENCOUNTER — Ambulatory Visit
Admission: RE | Admit: 2015-04-10 | Discharge: 2015-04-10 | Disposition: A | Discharge status: Home / Self Care | Payer: Medicaid Other | Source: Ambulatory Visit | Attending: Family Medicine | Admitting: Family Medicine

## 2015-04-10 DIAGNOSIS — R609 Edema, unspecified: Secondary | ICD-10-CM

## 2015-04-10 DIAGNOSIS — M79661 Pain in right lower leg: Secondary | ICD-10-CM | POA: Diagnosis present

## 2016-04-29 ENCOUNTER — Emergency Department
Admission: EM | Admit: 2016-04-29 | Discharge: 2016-04-29 | Disposition: A | Discharge status: Home / Self Care | Payer: Medicaid Other | Attending: Emergency Medicine | Admitting: Emergency Medicine

## 2016-04-29 ENCOUNTER — Encounter: Payer: Self-pay | Admitting: Emergency Medicine

## 2016-04-29 ENCOUNTER — Emergency Department: Payer: Medicaid Other

## 2016-04-29 DIAGNOSIS — Z79899 Other long term (current) drug therapy: Secondary | ICD-10-CM | POA: Diagnosis not present

## 2016-04-29 DIAGNOSIS — M5441 Lumbago with sciatica, right side: Secondary | ICD-10-CM | POA: Diagnosis not present

## 2016-04-29 DIAGNOSIS — M549 Dorsalgia, unspecified: Secondary | ICD-10-CM | POA: Diagnosis present

## 2016-04-29 DIAGNOSIS — J45909 Unspecified asthma, uncomplicated: Secondary | ICD-10-CM | POA: Insufficient documentation

## 2016-04-29 DIAGNOSIS — F17210 Nicotine dependence, cigarettes, uncomplicated: Secondary | ICD-10-CM | POA: Diagnosis not present

## 2016-04-29 MED ORDER — KETOROLAC TROMETHAMINE 60 MG/2ML IM SOLN
60.0000 mg | Freq: Once | INTRAMUSCULAR | Status: AC
  Administered 2016-04-29: 60 mg via INTRAMUSCULAR
  Filled 2016-04-29: qty 2

## 2016-04-29 MED ORDER — MORPHINE SULFATE (PF) 4 MG/ML IV SOLN
INTRAVENOUS | Status: AC
  Filled 2016-04-29: qty 1

## 2016-04-29 MED ORDER — DIAZEPAM 5 MG PO TABS: 5.0000 mg | ORAL_TABLET | Freq: Three times a day (TID) | ORAL | 0 refills | Status: AC | PRN

## 2016-04-29 MED ORDER — OXYCODONE-ACETAMINOPHEN 5-325 MG PO TABS
1.0000 | ORAL_TABLET | Freq: Once | ORAL | Status: AC
  Administered 2016-04-29: 1 via ORAL
  Filled 2016-04-29: qty 1

## 2016-04-29 MED ORDER — LIDOCAINE 5 % EX PTCH: 1.0000 | MEDICATED_PATCH | Freq: Two times a day (BID) | CUTANEOUS | 0 refills | Status: DC

## 2016-04-29 MED ORDER — ETODOLAC 200 MG PO CAPS: 200.0000 mg | ORAL_CAPSULE | Freq: Three times a day (TID) | ORAL | 0 refills | Status: DC

## 2016-04-29 MED ORDER — DIAZEPAM 5 MG PO TABS
5.0000 mg | ORAL_TABLET | Freq: Once | ORAL | Status: AC
  Administered 2016-04-29: 5 mg via ORAL
  Filled 2016-04-29: qty 1

## 2016-04-29 MED ORDER — LIDOCAINE 5 % EX PTCH
1.0000 | MEDICATED_PATCH | CUTANEOUS | Status: DC
  Administered 2016-04-29: 1 via TRANSDERMAL
  Filled 2016-04-29: qty 1

## 2016-04-29 NOTE — ED Provider Notes (Signed)
[facility] Emergency Department Provider Note   ____________________________________________   First MD Initiated Contact with Patient 04/29/16 0501     (approximate)  I have reviewed the triage vital signs and the nursing notes.   HISTORY  Chief Complaint Back Pain    HPI Teresa Malone is a 35 y.o. female is into the hospital today with back pain. The patient reports that 11 PM she started to hurt in the bottom part of her back and it keeps going down the right into her knee. She reports that she moved from the couch to the bed to lay with her son when her son fell asleep she had a hard time getting out of bed. She reports that she felt as though she couldn't move. She eventually was able to get up and lay on the recliner. She fell asleep for some time but then when she woke up she felt as though she couldn't move again. The patient had taken 3 Tylenol initially and then took 4 for pain. She reports that she iced her back as well. She denies any trauma to her back but reports she may have injured herself cleaning out her refrigerator. The patient reports that she fell in June and broke her foot. She is here for evaluation.   Past Medical History:  Diagnosis Date  . Asthma   . Bronchitis   . GERD (gastroesophageal reflux disease)     There are no active problems to display for this patient.   Past Surgical History:  Procedure Laterality Date  . CHOLECYSTECTOMY      Prior to Admission medications   Medication Sig Start Date End Date Taking? Authorizing Provider  albuterol (PROVENTIL HFA) 108 (90 BASE) MCG/ACT inhaler Inhale 2 puffs into the lungs every 4 (four) hours as needed for wheezing or shortness of breath. 01/10/15   Carrie Mew, MD  ALBUTEROL SULFATE HFA IN Inhale 1 puff into the lungs 2 (two) times daily as needed (shortness of breath or wheezing).    Historical Provider, MD  benzonatate (TESSALON) 200 MG capsule Take 1 capsule (200  mg total) by mouth 3 (three) times daily as needed for cough. 01/29/15   III William C Ruffian, PA-C  cetirizine (ZYRTEC) 10 MG tablet Take 10 mg by mouth daily.    Historical Provider, MD  diazepam (VALIUM) 5 MG tablet Take 1 tablet (5 mg total) by mouth every 8 (eight) hours as needed for anxiety. 04/29/16 04/29/17  Loney Hering, MD  doxycycline (VIBRAMYCIN) 100 MG capsule Take 1 capsule (100 mg total) by mouth 2 (two) times daily. 01/29/15   III Luanna Cole Ruffian, PA-C  etodolac (LODINE) 200 MG capsule Take 1 capsule (200 mg total) by mouth every 8 (eight) hours. 04/29/16   Loney Hering, MD  fluticasone (FLONASE) 50 MCG/ACT nasal spray Place 1 spray into both nostrils daily. 01/22/15 01/22/16  Loney Hering, MD  lidocaine (LIDODERM) 5 % Place 1 patch onto the skin every 12 (twelve) hours. Remove & Discard patch within 12 hours or as directed by MD 04/29/16 04/29/17  Loney Hering, MD  omeprazole (PRILOSEC) 40 MG capsule Take 40 mg by mouth daily.    Historical Provider, MD  predniSONE (STERAPRED UNI-PAK 21 TAB) 10 MG (21) TBPK tablet Take 6 tablets on day 1 Take 5 tablets on day 2 Take 4 tablets on day 3 Take 3 tablets on day 4 Take 2 tablets on day 5 Take 1 tablet on day  6 01/29/15   III William C Ruffian, PA-C  pseudoephedrine (SUDAFED) 30 MG tablet Take 1 tablet (30 mg total) by mouth every 6 (six) hours as needed for congestion. 01/22/15   Loney Hering, MD    Allergies Aspirin; Meloxicam; Morphine and related; and Tramadol  No family history on file.  Social History Social History  Substance Use Topics  . Smoking status: Current Every Day Smoker    Packs/day: 0.50    Types: Cigarettes  . Smokeless tobacco: Never Used  . Alcohol use No    Review of Systems Constitutional: No fever/chills Eyes: No visual changes. ENT: No sore throat. Cardiovascular: Denies chest pain. Respiratory: Denies shortness of breath. Gastrointestinal: No abdominal pain.  No nausea, no  vomiting.  No diarrhea.  No constipation. Genitourinary: Negative for dysuria. Musculoskeletal:  back pain. Skin: Negative for rash. Neurological: Negative for headaches, focal weakness or numbness.  10-point ROS otherwise negative.  ____________________________________________   PHYSICAL EXAM:  VITAL SIGNS: ED Triage Vitals  Enc Vitals Group     BP 04/29/16 0404 (!) 117/52     Pulse Rate 04/29/16 0404 80     Resp 04/29/16 0404 18     Temp 04/29/16 0404 97.5 F (36.4 C)     Temp Source 04/29/16 0404 Oral     SpO2 04/29/16 0404 96 %     Weight 04/29/16 0405 (!) 318 lb (144.2 kg)     Height 04/29/16 0405 5\' 6"  (1.676 m)     Head Circumference --      Peak Flow --      Pain Score 04/29/16 0404 7     Pain Loc --      Pain Edu? --      Excl. in [place]? --     Constitutional: Alert and oriented. Well appearing and in moderate distress. Eyes: Conjunctivae are normal. PERRL. EOMI. Head: Atraumatic. Nose: No congestion/rhinnorhea. Mouth/Throat: Mucous membranes are moist.  Oropharynx non-erythematous. Cardiovascular: Normal rate, regular rhythm. Grossly normal heart sounds.  Good peripheral circulation. Respiratory: Normal respiratory effort.  No retractions. Lungs CTAB. Gastrointestinal: Soft and nontender. No distention. Positive bowel sounds Musculoskeletal: Tenderness to palpation of midline low back and right SI joint with positive straight leg raise   Neurologic:  Normal speech and language.  Skin:  Skin is warm, dry and intact.  Psychiatric: Mood and affect are normal.   ____________________________________________   LABS (all labs ordered are listed, but only abnormal results are displayed)  Labs Reviewed - No data to display ____________________________________________  EKG  none ____________________________________________  RADIOLOGY  X-ray lumbar spine ____________________________________________   PROCEDURES  Procedure(s) performed:  None  Procedures  Critical Care performed: No  ____________________________________________   INITIAL IMPRESSION / ASSESSMENT AND PLAN / ED COURSE  Pertinent labs & imaging results that were available during my care of the patient were reviewed by me and considered in my medical decision making (see chart for details).  This is a 35 year old female who comes into the hospital today with some right-sided back pain. She reports that the pain goes down her leg. I did perform a x-ray of her leg looking for a possible injury. She does not know if she may have injured her back yesterday. I will give the patient a shot of Toradol, Valium and a Lidoderm patch.  Clinical Course  Value Comment By Time  DG Lumbar Spine 2-3 Views Negative Loney Hering, MD 09/19 [phone]    Patient's x-ray is unremarkable. She reports that  she still having some pains I will give the patient a shot of morphine. The patient should follow-up with orthopedic surgery for further evaluation of her back pain. ____________________________________________   FINAL CLINICAL IMPRESSION(S) / ED DIAGNOSES  Final diagnoses:  Right-sided low back pain with right-sided sciatica      NEW MEDICATIONS STARTED DURING THIS VISIT:  New Prescriptions   DIAZEPAM (VALIUM) 5 MG TABLET    Take 1 tablet (5 mg total) by mouth every 8 (eight) hours as needed for anxiety.   ETODOLAC (LODINE) 200 MG CAPSULE    Take 1 capsule (200 mg total) by mouth every 8 (eight) hours.   LIDOCAINE (LIDODERM) 5 %    Place 1 patch onto the skin every 12 (twelve) hours. Remove & Discard patch within 12 hours or as directed by MD     Note:  This document was prepared using Dragon voice recognition software and may include unintentional dictation errors.    Loney Hering, MD 04/29/16 [phone]

## 2016-04-29 NOTE — ED Triage Notes (Signed)
Pt presents to ED by EMS with c/o mid to lower back pain after she developed pain while cleaning out the refrigerator at home. Pt states her pain radiates down her legs. Pain has increased since onset. Ibuprofen taken at home with very little relief.

## 2016-04-29 NOTE — ED Notes (Signed)
Patient transported to X-ray 

## 2016-08-12 ENCOUNTER — Ambulatory Visit
Admission: EM | Admit: 2016-08-12 | Discharge: 2016-08-12 | Disposition: A | Discharge status: Home / Self Care | Payer: Medicaid Other | Attending: Family Medicine | Admitting: Family Medicine

## 2016-08-12 DIAGNOSIS — J209 Acute bronchitis, unspecified: Secondary | ICD-10-CM

## 2016-08-12 DIAGNOSIS — J018 Other acute sinusitis: Secondary | ICD-10-CM

## 2016-08-12 MED ORDER — PREDNISONE 10 MG (21) PO TBPK: ORAL_TABLET | ORAL | 0 refills | Status: DC

## 2016-08-12 MED ORDER — AZITHROMYCIN 500 MG PO TABS: ORAL_TABLET | ORAL | 0 refills | Status: DC

## 2016-08-12 MED ORDER — BENZONATATE 200 MG PO CAPS: 200.0000 mg | ORAL_CAPSULE | Freq: Three times a day (TID) | ORAL | 0 refills | Status: DC | PRN

## 2016-08-12 MED ORDER — ALBUTEROL SULFATE HFA 108 (90 BASE) MCG/ACT IN AERS: 2.0000 | INHALATION_SPRAY | Freq: Four times a day (QID) | RESPIRATORY_TRACT | 0 refills | Status: DC | PRN

## 2016-08-12 NOTE — ED Provider Notes (Signed)
[facility]    CSN: [id number] Arrival date & time: 08/12/16  1410     History   Chief Complaint Chief Complaint  Patient presents with  . Bronchitis    HPI Teresa Malone is a 36 y.o. female.   Patient's here because of callus-like illness. She reports having history of asthma she does smoke is 56 days she's had increased wheezing shortness of breath and bronchospasm she states she is down to about 5 cigarettes a day and denies smoking and heavier since she's had reduced his smoking. She states that last for 5 days she's been coughing up yellowish-green sputum she blows yellow-green sputum melanosis well she's been coughing more and she's had more bronchospasm. She states she is out of her albuterol inhaler and she is out of her albuterol nebulized since been able to get in to see her PCP. No fever at this time. Past surgeries cholecystectomy. Mother has history of COPD. And she has a history of GERD as well. She has multiple allergies such as aspirin morphine tramadol meloxicam and [place] spermicide gel    The history is provided by the patient. No language interpreter was used.  Cough  Cough characteristics:  Productive Sputum characteristics:  Brown, green and yellow Severity:  Moderate Onset quality:  Sudden Timing:  Constant Progression:  Worsening Chronicity:  New Context: upper respiratory infection   Relieved by:  Cough suppressants Worsened by:  Nothing Ineffective treatments:  None tried Associated symptoms: fever, myalgias, rhinorrhea, shortness of breath and sinus congestion     Past Medical History:  Diagnosis Date  . Asthma   . Bronchitis   . GERD (gastroesophageal reflux disease)     There are no active problems to display for this patient.   Past Surgical History:  Procedure Laterality Date  . CHOLECYSTECTOMY      OB History    No data available       Home Medications    Prior to Admission medications   Medication Sig Start Date  End Date Taking? Authorizing Provider  albuterol (PROVENTIL HFA) 108 (90 BASE) MCG/ACT inhaler Inhale 2 puffs into the lungs every 4 (four) hours as needed for wheezing or shortness of breath. 01/10/15  Yes Carrie Mew, MD  ALBUTEROL SULFATE HFA IN Inhale 1 puff into the lungs 2 (two) times daily as needed (shortness of breath or wheezing).   Yes Historical Provider, MD  omeprazole (PRILOSEC) 40 MG capsule Take 40 mg by mouth daily.   Yes Historical Provider, MD  albuterol (PROVENTIL HFA;VENTOLIN HFA) 108 (90 Base) MCG/ACT inhaler Inhale 2 puffs into the lungs every 6 (six) hours as needed for wheezing or shortness of breath. 08/12/16   Frederich Cha, MD  azithromycin (ZITHROMAX) 500 MG tablet 1 tablet daily for the next 5 days 08/12/16   Frederich Cha, MD  benzonatate (TESSALON) 200 MG capsule Take 1 capsule (200 mg total) by mouth 3 (three) times daily as needed. 08/12/16   Frederich Cha, MD  diazepam (VALIUM) 5 MG tablet Take 1 tablet (5 mg total) by mouth every 8 (eight) hours as needed for anxiety. 04/29/16 04/29/17  Loney Hering, MD  predniSONE (STERAPRED UNI-PAK 21 TAB) 10 MG (21) TBPK tablet Sig 6 tablet day 1, 5 tablets day 2, 4 tablets day 3,,3tablets day 4, 2 tablets day 5, 1 tablet day 6 take all tablets orally 08/12/16   Frederich Cha, MD    Family History Family History  Problem Relation Age of Onset  .  COPD Mother     Social History Social History  Substance Use Topics  . Smoking status: Current Every Day Smoker    Packs/day: 0.50    Types: Cigarettes  . Smokeless tobacco: Never Used  . Alcohol use No     Allergies   Aspirin; Ky plus spermicidal [nonoxynol 9]; Meloxicam; Morphine and related; and Tramadol   Review of Systems Review of Systems  Constitutional: Positive for fever.  HENT: Positive for rhinorrhea.   Respiratory: Positive for cough and shortness of breath.   Musculoskeletal: Positive for myalgias.  All other systems reviewed and are negative.    Physical  Exam Triage Vital Signs ED Triage Vitals  Enc Vitals Group     BP 08/12/16 1546 132/63     Pulse Rate 08/12/16 1546 95     Resp 08/12/16 1546 18     Temp 08/12/16 1546 98.4 F (36.9 C)     Temp Source 08/12/16 1546 Oral     SpO2 08/12/16 1546 97 %     Weight 08/12/16 1546 (!) 335 lb (152 kg)     Height 08/12/16 1546 5\' 6"  (1.676 m)     Head Circumference --      Peak Flow --      Pain Score 08/12/16 1545 7     Pain Loc --      Pain Edu? --      Excl. in [place]? --    No data found.   Updated Vital Signs BP 132/63 (BP Location: Left Arm)   Pulse 95   Temp 98.4 F (36.9 C) (Oral)   Resp 18   Ht 5\' 6"  (1.676 m)   Wt (!) 335 lb (152 kg)   SpO2 97%   BMI 54.07 kg/m   Visual Acuity Right Eye Distance:   Left Eye Distance:   Bilateral Distance:    Right Eye Near:   Left Eye Near:    Bilateral Near:     Physical Exam  Constitutional: She is oriented to person, place, and time. She appears well-developed and well-nourished.  HENT:  Head: Normocephalic and atraumatic.  Eyes: EOM are normal. Pupils are equal, round, and reactive to light.  Neck: Normal range of motion.  Cardiovascular: Normal rate and regular rhythm.   Pulmonary/Chest: Effort normal and breath sounds normal. No respiratory distress. She has no wheezes.  Musculoskeletal: Normal range of motion.  Neurological: She is alert and oriented to person, place, and time.  Skin: Skin is warm.  Psychiatric: She has a normal mood and affect.  Vitals reviewed.    UC Treatments / Results  Labs (all labs ordered are listed, but only abnormal results are displayed) Labs Reviewed - No data to display  EKG  EKG Interpretation None       Radiology No results found.  Procedures Procedures (including critical care time)  Medications Ordered in UC Medications - No data to display   Initial Impression / Assessment and Plan / UC Course  I have reviewed the triage vital signs and the nursing  notes.  Pertinent labs & imaging results that were available during my care of the patient were reviewed by me and considered in my medical decision making (see chart for details).  Clinical Course     Patient with thick heavy sputum production both blowing and nose and coughing. She feels is no problem with her smoking. We'll place her on Zithromax 500 mg one day for 5 days, UR inhaler 2 puffs every 6  8 hours as needed she needs to see her PCP to get more albuterol and nebulizer solution, Tessalon Perles 800 mg 1 capsule 3 times a day as needed for cough and prednisone 6 day course therapy. Follow-up PCP as stated above.  Final Clinical Impressions(s) / UC Diagnoses   Final diagnoses:  Other acute sinusitis, recurrence not specified  Acute bronchitis with bronchospasm    New Prescriptions Discharge Medication List as of 08/12/2016  5:08 PM    START taking these medications   Details  !! albuterol (PROVENTIL HFA;VENTOLIN HFA) 108 (90 Base) MCG/ACT inhaler Inhale 2 puffs into the lungs every 6 (six) hours as needed for wheezing or shortness of breath., Starting Tue 08/12/2016, Normal    azithromycin (ZITHROMAX) 500 MG tablet 1 tablet daily for the next 5 days, Normal    benzonatate (TESSALON) 200 MG capsule Take 1 capsule (200 mg total) by mouth 3 (three) times daily as needed., Starting Tue 08/12/2016, Normal    predniSONE (STERAPRED UNI-PAK 21 TAB) 10 MG (21) TBPK tablet Sig 6 tablet day 1, 5 tablets day 2, 4 tablets day 3,,3tablets day 4, 2 tablets day 5, 1 tablet day 6 take all tablets orally, Normal     !! - Potential duplicate medications found. Please discuss with provider.      Note: This dictation was prepared with Dragon dictation along with smaller phrase technology. Any transcriptional errors that result from this process are unintentional.   Frederich Cha, MD 08/12/16 [phone]

## 2016-08-12 NOTE — ED Triage Notes (Addendum)
Pt c/o cough, hard to take a deep breath,  Chest pain, and cold symptoms for over a week. Short of breath with exertion.

## 2017-02-02 ENCOUNTER — Emergency Department
Admission: EM | Admit: 2017-02-02 | Discharge: 2017-02-02 | Disposition: A | Discharge status: Home / Self Care | Payer: Medicaid Other | Attending: Emergency Medicine | Admitting: Emergency Medicine

## 2017-02-02 ENCOUNTER — Encounter: Payer: Self-pay | Admitting: Emergency Medicine

## 2017-02-02 ENCOUNTER — Emergency Department: Payer: Medicaid Other

## 2017-02-02 DIAGNOSIS — Z7951 Long term (current) use of inhaled steroids: Secondary | ICD-10-CM | POA: Insufficient documentation

## 2017-02-02 DIAGNOSIS — R1031 Right lower quadrant pain: Secondary | ICD-10-CM

## 2017-02-02 DIAGNOSIS — R197 Diarrhea, unspecified: Secondary | ICD-10-CM | POA: Insufficient documentation

## 2017-02-02 DIAGNOSIS — J45909 Unspecified asthma, uncomplicated: Secondary | ICD-10-CM | POA: Insufficient documentation

## 2017-02-02 DIAGNOSIS — F17210 Nicotine dependence, cigarettes, uncomplicated: Secondary | ICD-10-CM | POA: Insufficient documentation

## 2017-02-02 DIAGNOSIS — Z79899 Other long term (current) drug therapy: Secondary | ICD-10-CM | POA: Insufficient documentation

## 2017-02-02 DIAGNOSIS — N83201 Unspecified ovarian cyst, right side: Secondary | ICD-10-CM | POA: Insufficient documentation

## 2017-02-02 LAB — COMPREHENSIVE METABOLIC PANEL
ALT: 30 U/L (ref 14–54)
ANION GAP: 6 (ref 5–15)
AST: 38 U/L (ref 15–41)
Albumin: 3.6 g/dL (ref 3.5–5.0)
Alkaline Phosphatase: 93 U/L (ref 38–126)
BUN: 9 mg/dL (ref 6–20)
CO2: 25 mmol/L (ref 22–32)
CREATININE: 0.52 mg/dL (ref 0.44–1.00)
Calcium: 9 mg/dL (ref 8.9–10.3)
Chloride: 104 mmol/L (ref 101–111)
GFR calc non Af Amer: >60 mL/min (ref >60)
GLUCOSE: 127 mg/dL — AB (ref 65–99)
POTASSIUM: 3.9 mmol/L (ref 3.5–5.1)
SODIUM: 135 mmol/L (ref 135–145)
TOTAL PROTEIN: 7.2 g/dL (ref 6.5–8.1)
Total Bilirubin: 0.6 mg/dL (ref 0.3–1.2)

## 2017-02-02 LAB — CBC
HEMATOCRIT: 41.3 % (ref 35.0–47.0)
Hemoglobin: 13.7 g/dL (ref 12.0–16.0)
MCH: 28.7 pg (ref 26.0–34.0)
MCHC: 33.1 g/dL (ref 32.0–36.0)
MCV: 86.7 fL (ref 80.0–100.0)
PLATELETS: 265 10*3/uL (ref 150–440)
RBC: 4.76 MIL/uL (ref 3.80–5.20)
RDW: 14.5 % (ref 11.5–14.5)
WBC: 13.1 10*3/uL — ABNORMAL HIGH (ref 3.6–11.0)

## 2017-02-02 LAB — URINALYSIS, COMPLETE (UACMP) WITH MICROSCOPIC
BACTERIA UA: NONE SEEN
Bilirubin Urine: NEGATIVE
Glucose, UA: NEGATIVE mg/dL
Ketones, ur: NEGATIVE mg/dL
Leukocytes, UA: NEGATIVE
NITRITE: NEGATIVE
PH: 8 (ref 5.0–8.0)
Protein, ur: NEGATIVE mg/dL
SPECIFIC GRAVITY, URINE: 1.019 (ref 1.005–1.030)

## 2017-02-02 LAB — LIPASE, BLOOD: LIPASE: 38 U/L (ref 11–51)

## 2017-02-02 LAB — POCT PREGNANCY, URINE: PREG TEST UR: NEGATIVE

## 2017-02-02 MED ORDER — KETOROLAC TROMETHAMINE 30 MG/ML IJ SOLN
30.0000 mg | Freq: Once | INTRAMUSCULAR | Status: AC
  Administered 2017-02-02: 30 mg via INTRAVENOUS
  Filled 2017-02-02: qty 1

## 2017-02-02 NOTE — ED Triage Notes (Signed)
Lower R abd pain x 3 weeks, intermittent .

## 2017-02-02 NOTE — ED Notes (Signed)
Pt given turkey sandwich tray 

## 2017-02-02 NOTE — ED Notes (Signed)
Pt taken to CT via stretcher.

## 2017-02-02 NOTE — ED Notes (Signed)
Pt alert, oriented, ambulatory. States RLQ abd pain x 3 weeks. States nausea and diarrhea. Denies blood in stool. Denies vomiting. Still has appendix.

## 2017-02-02 NOTE — ED Provider Notes (Signed)
[facility] Emergency Department Provider Note  Time seen: 4:30 PM  I have reviewed the triage vital signs and the nursing notes.   HISTORY  Chief Complaint Abdominal Pain    HPI Teresa Malone is a 36 y.o. female with a past medical history of asthma, presents to the emergency department for intermittent right flank pain. According to the patient for the past 3 weeks she has been having intermittent pain in her right flank. States it is sharp, comes and goes, lasts seconds to minutes and then dissipated. Denies any nausea or vomiting but does state she has been having intermittent episodes of diarrhea over this timeframe as well. Denies any fever. She states for the past 2 or 3 days the pain has been more constant which prompted today's ER visit. Currently describes the pain as a 5 or 6/10. 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 Denies any dysuria or hematuria. Denies vaginal bleeding or discharge.  Past Medical History:  Diagnosis Date  . Asthma   . Bronchitis   . GERD (gastroesophageal reflux disease)     There are no active problems to display for this patient.   Past Surgical History:  Procedure Laterality Date  . CHOLECYSTECTOMY      Prior to Admission medications   Medication Sig Start Date End Date Taking? Authorizing Provider  albuterol (PROVENTIL HFA) 108 (90 BASE) MCG/ACT inhaler Inhale 2 puffs into the lungs every 4 (four) hours as needed for wheezing or shortness of breath. 01/10/15   Carrie Mew, MD  albuterol (PROVENTIL HFA;VENTOLIN HFA) 108 (90 Base) MCG/ACT inhaler Inhale 2 puffs into the lungs every 6 (six) hours as needed for wheezing or shortness of breath. 08/12/16   Frederich Cha, MD  ALBUTEROL SULFATE HFA IN Inhale 1 puff into the lungs 2 (two) times daily as needed (shortness of breath or wheezing).    [provider]  azithromycin (ZITHROMAX) 500 MG tablet 1 tablet daily for the next 5  days 08/12/16   Frederich Cha, MD  benzonatate (TESSALON) 200 MG capsule Take 1 capsule (200 mg total) by mouth 3 (three) times daily as needed. 08/12/16   Frederich Cha, MD  diazepam (VALIUM) 5 MG tablet Take 1 tablet (5 mg total) by mouth every 8 (eight) hours as needed for anxiety. 04/29/16 04/29/17  Loney Hering, MD  omeprazole (PRILOSEC) 40 MG capsule Take 40 mg by mouth daily.    [provider]  predniSONE (STERAPRED UNI-PAK 21 TAB) 10 MG (21) TBPK tablet Sig 6 tablet day 1, 5 tablets day 2, 4 tablets day 3,,3tablets day 4, 2 tablets day 5, 1 tablet day 6 take all tablets orally 08/12/16   Frederich Cha, MD    Allergies  Allergen Reactions  . Aspirin Nausea And Vomiting  . [place] Plus Spermicidal [Nonoxynol 9] Other (See Comments)    Yeast Infection   . Meloxicam Hives  . Morphine And Related Hives  . Tramadol Hives    Family History  Problem Relation Age of Onset  . COPD Mother     Social History Social History  Substance Use Topics  . Smoking status: Current Every Day Smoker    Packs/day: 0.50    Types: Cigarettes  . Smokeless tobacco: Never Used  . Alcohol use No    Review of Systems Constitutional: Negative for fever Cardiovascular: Negative for chest pain. Respiratory: Negative for shortness of breath. Gastrointestinal: Right flank pain. Negative for nausea or vomiting.  Positive for diarrhea. Genitourinary: Negative for dysuria. Negative for vaginal bleeding/discharge. Musculoskeletal: Negative for back pain. Neurological: Negative for headache All other ROS negative  ____________________________________________   PHYSICAL EXAM:  VITAL SIGNS: ED Triage Vitals  Enc Vitals Group     BP 02/02/17 1509 122/75     Pulse Rate 02/02/17 1509 87     Resp 02/02/17 1509 18     Temp 02/02/17 1509 98.1 F (36.7 C)     Temp Source 02/02/17 1509 Oral     SpO2 02/02/17 1509 99 %     Weight 02/02/17 1510 (!) 338 lb (153.3 kg)     Height 02/02/17 1510 5\' 6"  (1.676  m)     Head Circumference --      Peak Flow --      Pain Score 02/02/17 1509 6     Pain Loc --      Pain Edu? --      Excl. in [place]? --     Constitutional: Alert and oriented. Well appearing and in no distress. Eyes: Normal exam ENT   Head: Normocephalic and atraumatic   Mouth/Throat: Mucous membranes are moist. Cardiovascular: Normal rate, regular rhythm. No murmur Respiratory: Normal respiratory effort without tachypnea nor retractions. Breath sounds are clear  Gastrointestinal: Soft, mild right mid abdominal tenderness to palpation. No rebound or guarding. No distention. Musculoskeletal: Nontender with normal range of motion in all extremities.  Neurologic:  Normal speech and language. No gross focal neurologic deficits Skin:  Skin is warm, dry and intact.  Psychiatric: Mood and affect are normal.   ____________________________________________     RADIOLOGY  IMPRESSION: 1. Mild prominence the right adnexal contour. Pelvic sonography may be helpful to further assess the right ovary and right fallopian tube. 2. Mild hepatosplenomegaly, cause uncertain. 3. Diffuse hepatic steatosis. 4. Mild cardiomegaly. Given the patient's age this may warrant further cardiac workup. 5. Normal appearance of the appendix. 6. Mild sclerosis along the iliac side of the sacroiliac joints, right greater than left, suggesting mild chronic sacroiliitis. 7. Disc osteophyte complex at L4-5 causing central narrowing of the thecal sac and bilateral foraminal impingement. ____________________________________________   INITIAL IMPRESSION / ASSESSMENT AND PLAN / ED COURSE  Pertinent labs & imaging results that were available during my care of the patient were reviewed by me and considered in my medical decision making (see chart for details).  The patient presents to the emergency department for 3 weeks of intermittent right flank pain now more constant over the past 2-3 days. Mild right mid  abdominal tenderness without tenderness to the lower quadrant or upper quadrant. Patient is status post cholecystectomy, normal LFTs. Normal lipase. Mild leukocytosis of 13,000. Normal urinalysis. We will obtain a CT renal scan to rule out ureterolithiasis, as well as intra-abdominal pathology. Overall the patient appears well, calm and cooperative, pleasant and in no distress.  CT shows normal appendix. However she does have prominence of the right adnexa we will obtain an ultrasound as a precaution. We will treat with Toradol awaiting RESULTS. Patient agreeable to plan.  Shows ovarian cyst possible hemorrhagic cyst. We'll have the patient follow-up with GYN in 6 weeks for recheck.  ____________________________________________   FINAL CLINICAL IMPRESSION(S) / ED DIAGNOSES  Right flank pain Right ovarian cyst   Harvest Dark, MD 02/02/17 Curly Rim

## 2017-02-02 NOTE — ED Notes (Signed)
Pt taken to US

## 2017-02-02 NOTE — ED Notes (Signed)
Pt returned from CT via stretcher.

## 2017-02-20 ENCOUNTER — Other Ambulatory Visit: Payer: Self-pay | Admitting: Family Medicine

## 2017-02-20 DIAGNOSIS — N63 Unspecified lump in unspecified breast: Secondary | ICD-10-CM

## 2017-02-27 ENCOUNTER — Ambulatory Visit
Admission: RE | Admit: 2017-02-27 | Discharge: 2017-02-27 | Disposition: A | Discharge status: Home / Self Care | Payer: Medicaid Other | Source: Ambulatory Visit | Attending: Family Medicine | Admitting: Family Medicine

## 2017-02-27 DIAGNOSIS — N63 Unspecified lump in unspecified breast: Secondary | ICD-10-CM

## 2017-02-27 DIAGNOSIS — N6312 Unspecified lump in the right breast, upper inner quadrant: Secondary | ICD-10-CM | POA: Insufficient documentation

## 2017-03-13 ENCOUNTER — Ambulatory Visit
Admission: RE | Admit: 2017-03-13 | Discharge: 2017-03-13 | Payer: MEDICAID | Attending: Orthopaedic Surgery | Admitting: Orthopaedic Surgery

## 2017-03-13 DIAGNOSIS — G5601 Carpal tunnel syndrome, right upper limb: Principal | ICD-10-CM

## 2017-04-07 DIAGNOSIS — G5601 Carpal tunnel syndrome, right upper limb: Principal | ICD-10-CM

## 2017-04-08 ENCOUNTER — Ambulatory Visit
Admission: RE | Admit: 2017-04-08 | Discharge: 2017-04-08 | Disposition: A | Discharge status: Home / Self Care | Payer: MEDICAID | Attending: Student in an Organized Health Care Education/Training Program | Admitting: Student in an Organized Health Care Education/Training Program

## 2017-04-08 ENCOUNTER — Ambulatory Visit
Admission: RE | Admit: 2017-04-08 | Discharge: 2017-04-08 | Disposition: A | Discharge status: Home / Self Care | Payer: MEDICAID | Attending: Orthopaedic Surgery | Admitting: Orthopaedic Surgery

## 2017-04-08 DIAGNOSIS — G5601 Carpal tunnel syndrome, right upper limb: Principal | ICD-10-CM

## 2017-04-08 MED ORDER — OXYCODONE-ACETAMINOPHEN 5 MG-325 MG TABLET
ORAL_TABLET | Freq: Four times a day (QID) | ORAL | 0 refills | 0.00000 days | Status: CP | PRN
Start: 2017-04-08 — End: 2017-04-13

## 2017-05-08 ENCOUNTER — Emergency Department
Admission: EM | Admit: 2017-05-08 | Discharge: 2017-05-08 | Disposition: A | Discharge status: Home / Self Care | Payer: Medicaid Other | Attending: Emergency Medicine | Admitting: Emergency Medicine

## 2017-05-08 ENCOUNTER — Encounter: Payer: Self-pay | Admitting: Emergency Medicine

## 2017-05-08 ENCOUNTER — Emergency Department: Payer: Medicaid Other

## 2017-05-08 DIAGNOSIS — R1084 Generalized abdominal pain: Secondary | ICD-10-CM | POA: Insufficient documentation

## 2017-05-08 DIAGNOSIS — J45909 Unspecified asthma, uncomplicated: Secondary | ICD-10-CM | POA: Insufficient documentation

## 2017-05-08 DIAGNOSIS — F17210 Nicotine dependence, cigarettes, uncomplicated: Secondary | ICD-10-CM | POA: Insufficient documentation

## 2017-05-08 DIAGNOSIS — Z79899 Other long term (current) drug therapy: Secondary | ICD-10-CM | POA: Diagnosis not present

## 2017-05-08 DIAGNOSIS — K5900 Constipation, unspecified: Secondary | ICD-10-CM

## 2017-05-08 LAB — URINALYSIS, COMPLETE (UACMP) WITH MICROSCOPIC
BACTERIA UA: NONE SEEN
BILIRUBIN URINE: NEGATIVE
Glucose, UA: NEGATIVE mg/dL
Ketones, ur: NEGATIVE mg/dL
LEUKOCYTES UA: NEGATIVE
Nitrite: NEGATIVE
PH: 6 (ref 5.0–8.0)
Protein, ur: 30 mg/dL — AB
SPECIFIC GRAVITY, URINE: 1.019 (ref 1.005–1.030)

## 2017-05-08 LAB — COMPREHENSIVE METABOLIC PANEL
ALBUMIN: 3.5 g/dL (ref 3.5–5.0)
ALT: 36 U/L (ref 14–54)
AST: 56 U/L — AB (ref 15–41)
Alkaline Phosphatase: 70 U/L (ref 38–126)
Anion gap: 9 (ref 5–15)
BUN: 8 mg/dL (ref 6–20)
CHLORIDE: 105 mmol/L (ref 101–111)
CO2: 24 mmol/L (ref 22–32)
CREATININE: 0.54 mg/dL (ref 0.44–1.00)
Calcium: 9 mg/dL (ref 8.9–10.3)
GFR calc Af Amer: >60 mL/min (ref >60)
Glucose, Bld: 92 mg/dL (ref 65–99)
POTASSIUM: 3.9 mmol/L (ref 3.5–5.1)
SODIUM: 138 mmol/L (ref 135–145)
Total Bilirubin: 0.9 mg/dL (ref 0.3–1.2)
Total Protein: 7 g/dL (ref 6.5–8.1)

## 2017-05-08 LAB — PREGNANCY, URINE: PREG TEST UR: NEGATIVE

## 2017-05-08 LAB — CBC
HEMATOCRIT: 40.3 % (ref 35.0–47.0)
Hemoglobin: 13.5 g/dL (ref 12.0–16.0)
MCH: 29.2 pg (ref 26.0–34.0)
MCHC: 33.4 g/dL (ref 32.0–36.0)
MCV: 87.3 fL (ref 80.0–100.0)
PLATELETS: 235 10*3/uL (ref 150–440)
RBC: 4.62 MIL/uL (ref 3.80–5.20)
RDW: 14.9 % — AB (ref 11.5–14.5)
WBC: 12.1 10*3/uL — ABNORMAL HIGH (ref 3.6–11.0)

## 2017-05-08 LAB — LIPASE, BLOOD: Lipase: 22 U/L (ref 11–51)

## 2017-05-08 MED ORDER — SENNOSIDES-DOCUSATE SODIUM 8.6-50 MG PO TABS: 2.0000 | ORAL_TABLET | Freq: Two times a day (BID) | ORAL | 0 refills | Status: DC

## 2017-05-08 MED ORDER — POLYETHYLENE GLYCOL 3350 17 GM/SCOOP PO POWD: ORAL | 0 refills | Status: DC

## 2017-05-08 NOTE — ED Notes (Signed)
Called lab to add on urine pregnancy test

## 2017-05-08 NOTE — ED Notes (Signed)
Pt presents with generalized abdominal pain x 2 weeks; states she feels like her bowel movements are incomplete for 2 weeks as well. Affirms nausea, denies vomiting; states bowel movements are very loose and small. Pt alert & oriented with NAD noted.

## 2017-05-08 NOTE — ED Notes (Signed)
Pt discharged home after verbalizing understanding of discharge instructions; nad noted. 

## 2017-05-08 NOTE — ED Triage Notes (Signed)
Pt reports for the last couple of weeks she has had intermittent abdominal pain and bowel movements that are soft but not a lot. Pt reports she goes frequently because it feels like she needs to have a BM but not a lot comes out. Pt denies urinary sx's, pain elsewhere or vomitting.

## 2017-05-08 NOTE — ED Provider Notes (Signed)
[facility] Emergency Department Provider Note  ____________________________________________  Time seen: Approximately 4:22 PM  I have reviewed the triage vital signs and the nursing notes.   HISTORY  Chief Complaint Abdominal Cramping and Constipation    HPI Teresa Malone is a 36 y.o. female who complains of crampy generalized abdominal pain for the past 2 weeks, intermittent, no aggravating or alleviating factors. She has had only small bowel movements over the past 2 weeks as well. No nausea or vomiting. Eating and drinking normally. She started having some abdominal pain she tried Pepto, and then she also tried leftover Percocet for the pain. Denies any urinary symptoms or vaginal bleeding or discharge. No chest pain shortness of breath fever chills back pain or dizziness.     Past Medical History:  Diagnosis Date  . Asthma   . Bronchitis   . GERD (gastroesophageal reflux disease)      There are no active problems to display for this patient.    Past Surgical History:  Procedure Laterality Date  . CHOLECYSTECTOMY       Prior to Admission medications   Medication Sig Start Date End Date Taking? Authorizing Provider  albuterol (PROVENTIL HFA) 108 (90 BASE) MCG/ACT inhaler Inhale 2 puffs into the lungs every 4 (four) hours as needed for wheezing or shortness of breath. 01/10/15   Carrie Mew, MD  albuterol (PROVENTIL HFA;VENTOLIN HFA) 108 (90 Base) MCG/ACT inhaler Inhale 2 puffs into the lungs every 6 (six) hours as needed for wheezing or shortness of breath. 08/12/16   Frederich Cha, MD  ALBUTEROL SULFATE HFA IN Inhale 1 puff into the lungs 2 (two) times daily as needed (shortness of breath or wheezing).    [provider]  azithromycin (ZITHROMAX) 500 MG tablet 1 tablet daily for the next 5 days 08/12/16   Frederich Cha, MD  benzonatate (TESSALON) 200 MG capsule Take 1 capsule (200 mg total) by mouth 3 (three) times daily as needed.  08/12/16   Frederich Cha, MD  omeprazole (PRILOSEC) 40 MG capsule Take 40 mg by mouth daily.    [provider]  polyethylene glycol powder (GLYCOLAX/MIRALAX) powder 1 cap full in a full glass of water, two times a day for 3 days. 05/08/17   Carrie Mew, MD  predniSONE (STERAPRED UNI-PAK 21 TAB) 10 MG (21) TBPK tablet Sig 6 tablet day 1, 5 tablets day 2, 4 tablets day 3,,3tablets day 4, 2 tablets day 5, 1 tablet day 6 take all tablets orally 08/12/16   Frederich Cha, MD  senna-docusate (SENOKOT-S) 8.6-50 MG tablet Take 2 tablets by mouth 2 (two) times daily. 05/08/17   Carrie Mew, MD     Allergies Aspirin; Ky plus spermicidal [nonoxynol 9]; Meloxicam; Morphine and related; and Tramadol   Family History  Problem Relation Age of Onset  . COPD Mother     Social History Social History  Substance Use Topics  . Smoking status: Current Every Day Smoker    Packs/day: 0.50    Types: Cigarettes  . Smokeless tobacco: Never Used  . Alcohol use No    Review of Systems  Constitutional:   No fever or chills.  ENT:   No sore throat. No rhinorrhea. Cardiovascular:   No chest pain or syncope. Respiratory:   No dyspnea or cough. Gastrointestinal:  positive as above for abdominal pain and constipation.  Musculoskeletal:   Negative for focal pain or swelling All other systems reviewed and are negative except as documented above in ROS and  HPI.  ____________________________________________   PHYSICAL EXAM:  VITAL SIGNS: ED Triage Vitals [05/08/17 1251]  Enc Vitals Group     BP 126/60     Pulse Rate 85     Resp 20     Temp 98.5 F (36.9 C)     Temp Source Oral     SpO2 96 %     Weight (!) 329 lb (149.2 kg)     Height 5\' 6"  (1.676 m)     Head Circumference      Peak Flow      Pain Score 5     Pain Loc      Pain Edu?      Excl. in [place]?     Vital signs reviewed, nursing assessments reviewed.   Constitutional:   Alert and oriented. Well appearing and in no  distress. Eyes:   No scleral icterus.  EOMI. No nystagmus. No conjunctival pallor. PERRL. ENT   Head:   Normocephalic and atraumatic.   Nose:   No congestion/rhinnorhea.    Mouth/Throat:   MMM, no pharyngeal erythema. No peritonsillar mass.    Neck:   No meningismus. Full ROM Hematological/Lymphatic/Immunilogical:   No cervical lymphadenopathy. Cardiovascular:   RRR. Symmetric bilateral radial and DP pulses.  No murmurs.  Respiratory:   Normal respiratory effort without tachypnea/retractions. Breath sounds are clear and equal bilaterally. No wheezes/rales/rhonchi. Gastrointestinal:   Soft and nontender. Non distended. There is no CVA tenderness.  No rebound, rigidity, or guarding. Genitourinary:   deferred Musculoskeletal:   Normal range of motion in all extremities. No joint effusions.  No lower extremity tenderness.  No edema. Neurologic:   Normal speech and language.  Motor grossly intact. No gross focal neurologic deficits are appreciated.  Skin:    Skin is warm, dry and intact. No rash noted.  No petechiae, purpura, or bullae.  ____________________________________________    LABS (pertinent positives/negatives) (all labs ordered are listed, but only abnormal results are displayed) Labs Reviewed  COMPREHENSIVE METABOLIC PANEL - Abnormal; Notable for the following:       Result Value   AST 56 (*)    All other components within normal limits  CBC - Abnormal; Notable for the following:    WBC 12.1 (*)    RDW 14.9 (*)    All other components within normal limits  URINALYSIS, COMPLETE (UACMP) WITH MICROSCOPIC - Abnormal; Notable for the following:    Color, Urine YELLOW (*)    APPearance CLEAR (*)    Hgb urine dipstick SMALL (*)    Protein, ur 30 (*)    Squamous Epithelial / LPF 0-5 (*)    All other components within normal limits  LIPASE, BLOOD  PREGNANCY, URINE    ____________________________________________   EKG    ____________________________________________    RADIOLOGY  Dg Abdomen Acute W/chest  Result Date: 05/08/2017 CLINICAL DATA:  Generalized abdominal pain, nausea for 3 weeks. Constipation. EXAM: DG ABDOMEN ACUTE W/ 1V CHEST COMPARISON:  CT scan of September 04, 2016. FINDINGS: There is no evidence of dilated bowel loops or free intraperitoneal air. Moderate amount of stool seen in the right colon. No radiopaque calculi or other significant radiographic abnormality is seen. Heart size and mediastinal contours are within normal limits. Both lungs are clear. Intrauterine device is noted. IMPRESSION: No evidence of bowel obstruction or ileus. Moderate stool burden is noted. No acute cardiopulmonary disease. Electronically Signed   By: Marijo Conception, M.D.   On: 05/08/2017 15:43    ____________________________________________  PROCEDURES Procedures  ____________________________________________   INITIAL IMPRESSION / ASSESSMENT AND PLAN / ED COURSE  Pertinent labs & imaging results that were available during my care of the patient were reviewed by me and considered in my medical decision making (see chart for details).  patient well appearing no acute distress, unremarkable vital signs, unremarkable labs. Presents with generalized abdominal pain anddecreased bowel movements, concerning for constipation versus bowel obstruction versus intussusception or volvulus. She does not appear to have significant pain and exam is reassuring. X-ray does show stool burden and no air-fluid levels. This is all consistent with constipation. I'll have her take Senokot and MiraLAX, follow up with primary care. Low suspicion of appendicitis cholecystitis or torsion TOA or PID.      ____________________________________________   FINAL CLINICAL IMPRESSION(S) / ED DIAGNOSES  Final diagnoses:  Generalized abdominal pain  Constipation, unspecified  constipation type      New Prescriptions   POLYETHYLENE GLYCOL POWDER (GLYCOLAX/MIRALAX) POWDER    1 cap full in a full glass of water, two times a day for 3 days.   SENNA-DOCUSATE (SENOKOT-S) 8.6-50 MG TABLET    Take 2 tablets by mouth 2 (two) times daily.     Portions of this note were generated with dragon dictation software. Dictation errors may occur despite best attempts at proofreading.    Carrie Mew, MD 05/08/17 [phone]

## 2017-10-27 ENCOUNTER — Encounter
Admission: RE | Disposition: A | Discharge status: Home / Self Care | Payer: Self-pay | Source: Ambulatory Visit | Attending: Internal Medicine

## 2017-10-27 ENCOUNTER — Ambulatory Visit: Payer: Medicaid Other | Admitting: Anesthesiology

## 2017-10-27 ENCOUNTER — Ambulatory Visit
Admission: RE | Admit: 2017-10-27 | Discharge: 2017-10-27 | Disposition: A | Discharge status: Home / Self Care | Payer: Medicaid Other | Source: Ambulatory Visit | Attending: Internal Medicine | Admitting: Internal Medicine

## 2017-10-27 ENCOUNTER — Encounter: Payer: Self-pay | Admitting: Anesthesiology

## 2017-10-27 DIAGNOSIS — Z6841 Body Mass Index (BMI) 40.0 and over, adult: Secondary | ICD-10-CM | POA: Diagnosis not present

## 2017-10-27 DIAGNOSIS — F17200 Nicotine dependence, unspecified, uncomplicated: Secondary | ICD-10-CM | POA: Insufficient documentation

## 2017-10-27 DIAGNOSIS — R1030 Lower abdominal pain, unspecified: Secondary | ICD-10-CM | POA: Diagnosis not present

## 2017-10-27 DIAGNOSIS — D122 Benign neoplasm of ascending colon: Secondary | ICD-10-CM | POA: Insufficient documentation

## 2017-10-27 DIAGNOSIS — K591 Functional diarrhea: Secondary | ICD-10-CM | POA: Diagnosis not present

## 2017-10-27 DIAGNOSIS — K219 Gastro-esophageal reflux disease without esophagitis: Secondary | ICD-10-CM | POA: Diagnosis not present

## 2017-10-27 DIAGNOSIS — K640 First degree hemorrhoids: Secondary | ICD-10-CM | POA: Insufficient documentation

## 2017-10-27 DIAGNOSIS — J45909 Unspecified asthma, uncomplicated: Secondary | ICD-10-CM | POA: Diagnosis not present

## 2017-10-27 DIAGNOSIS — K3189 Other diseases of stomach and duodenum: Secondary | ICD-10-CM | POA: Diagnosis not present

## 2017-10-27 DIAGNOSIS — R1314 Dysphagia, pharyngoesophageal phase: Secondary | ICD-10-CM | POA: Diagnosis not present

## 2017-10-27 DIAGNOSIS — R194 Change in bowel habit: Secondary | ICD-10-CM | POA: Diagnosis present

## 2017-10-27 HISTORY — PX: ESOPHAGOGASTRODUODENOSCOPY (EGD) WITH PROPOFOL: SHX5813

## 2017-10-27 HISTORY — PX: COLONOSCOPY WITH PROPOFOL: SHX5780

## 2017-10-27 HISTORY — DX: Hypoglycemia, unspecified: E16.2

## 2017-10-27 LAB — POCT PREGNANCY, URINE: Preg Test, Ur: NEGATIVE

## 2017-10-27 SURGERY — COLONOSCOPY WITH PROPOFOL
Anesthesia: General

## 2017-10-27 MED ORDER — SODIUM CHLORIDE 0.9 % IV SOLN
INTRAVENOUS | Status: DC
  Administered 2017-10-27: 1000 mL via INTRAVENOUS

## 2017-10-27 MED ORDER — PROPOFOL 10 MG/ML IV BOLUS
INTRAVENOUS | Status: AC
  Filled 2017-10-27: qty 20

## 2017-10-27 MED ORDER — PROPOFOL 500 MG/50ML IV EMUL
INTRAVENOUS | Status: DC | PRN
  Administered 2017-10-27: 125 ug/kg/min via INTRAVENOUS

## 2017-10-27 MED ORDER — LIDOCAINE HCL (PF) 1 % IJ SOLN
2.0000 mL | Freq: Once | INTRAMUSCULAR | Status: AC
  Administered 2017-10-27: 0.3 mL via INTRADERMAL

## 2017-10-27 MED ORDER — LIDOCAINE HCL (PF) 1 % IJ SOLN
INTRAMUSCULAR | Status: AC
  Administered 2017-10-27: 0.3 mL via INTRADERMAL
  Filled 2017-10-27: qty 2

## 2017-10-27 MED ORDER — PROPOFOL 10 MG/ML IV BOLUS
INTRAVENOUS | Status: DC | PRN
  Administered 2017-10-27 (×3): 100 mg via INTRAVENOUS

## 2017-10-27 MED ORDER — LIDOCAINE HCL (CARDIAC) 20 MG/ML IV SOLN
INTRAVENOUS | Status: DC | PRN
  Administered 2017-10-27 (×2): 100 mg via INTRAVENOUS

## 2017-10-27 MED ORDER — FENTANYL CITRATE (PF) 100 MCG/2ML IJ SOLN
INTRAMUSCULAR | Status: DC | PRN
  Administered 2017-10-27 (×2): 50 ug via INTRAVENOUS

## 2017-10-27 MED ORDER — FENTANYL CITRATE (PF) 100 MCG/2ML IJ SOLN
INTRAMUSCULAR | Status: AC
  Filled 2017-10-27: qty 2

## 2017-10-27 NOTE — Op Note (Signed)
[facility] Gastroenterology Patient Name: Teresa Malone Procedure Date: 10/27/2017 9:13 AM MRN: [id number] Account #: [id number] Date of Birth: 04-02-1981 Admit Type: Outpatient Age: 37 Room: [facility] ENDO ROOM 2 Gender: Female Note Status: Finalized Procedure:            Upper GI endoscopy Indications:          Lower abdominal pain, Pharyngeal phase dysphagia,                        Esophageal dysphagia, Suspected reflux esophagitis Providers:            Lorie Apley K. Alice Reichert MD, MD Referring MD:         [facility] (Referring MD) Medicines:            Propofol per Anesthesia Complications:        No immediate complications. Procedure:            Pre-Anesthesia Assessment:                       - The risks and benefits of the procedure and the                        sedation options and risks were discussed with the                        patient. All questions were answered and informed                        consent was obtained.                       - Patient identification and proposed procedure were                        verified prior to the procedure by the nurse. The                        procedure was verified in the pre-procedure area in the                        procedure room.                       - ASA Grade Assessment: II - A patient with mild                        systemic disease.                       - After reviewing the risks and benefits, the patient                        was deemed in satisfactory condition to undergo the                        procedure.                       After obtaining informed consent, the endoscope was  passed under direct vision. Throughout the procedure,                        the patient's blood pressure, pulse, and oxygen                        saturations were monitored continuously. The Endoscope                        was introduced through the mouth, and advanced  to the                        third part of duodenum. The upper GI endoscopy was                        accomplished without difficulty. The patient tolerated                        the procedure well. Findings:      No endoscopic abnormality was evident in the esophagus to explain the       patient's complaint of dysphagia. The scope was withdrawn. Dilation was       performed with a Maloney dilator with no resistance at 60 Fr.      Localized mildly erythematous mucosa without bleeding was found in the       gastric antrum. Biopsies were taken with a cold forceps for Helicobacter       pylori testing.      The cardia and gastric fundus were normal on retroflexion.      The examined duodenum was normal. Impression:           - No endoscopic esophageal abnormality to explain                        patient's dysphagia. Dilated.                       - Erythematous mucosa in the antrum. Biopsied.                       - Normal examined duodenum. Recommendation:       - Await pathology results.                       - Proceed with colonoscopy Procedure Code(s):    --- Professional ---                       [phone], Esophagogastroduodenoscopy, flexible, transoral;                        with biopsy, single or multiple                       43450, Dilation of esophagus, by unguided sound or                        bougie, single or multiple passes Diagnosis Code(s):    --- Professional ---                       K31.89, Other diseases of stomach and duodenum  R10.30, Lower abdominal pain, unspecified                       R13.13, Dysphagia, pharyngeal phase                       R13.14, Dysphagia, pharyngoesophageal phase CPT copyright 2016 American Medical Association. All rights reserved. The codes documented in this report are preliminary and upon coder review may  be revised to meet current compliance requirements. Efrain Sella MD, MD 10/27/2017 9:45:24 AM This  report has been signed electronically. Number of Addenda: 0 Note Initiated On: 10/27/2017 9:13 AM      [facility]

## 2017-10-27 NOTE — H&P (Signed)
Outpatient short stay form Pre-procedure 10/27/2017 9:07 AM Teresa Kimble K. Teresa Malone, M.D.  Primary Physician: Rutherford Guys, M.D.  Reason for visit:  Change in bowel habits, pharyngoesophageal dysphagia  History of present illness: patient is a 37 year old female with the above complaints. She is preop for bariatric surgery at [facility] with Dr. Marchelle Folks. She's had worsening GERD symptomswith a 15 pound weight gain over the past year. She is increased omeprazole 40 mgtwice daily which has improved some of her exacerbations. She continues to haveript dysphagia and arest above the sternal notch but this usually only accompanies an attack of GERD. Patient denies any abdominal pain. Patient has occasional abdominal discomfort in the lower abdomen but this is not central feature of her complaint. She describes having "up to 15 bowel movements per day" without rectal bleeding. She has been diagnosed with "colitis" in the past.    Current Facility-Administered Medications:  .  0.9 %  sodium chloride infusion, , Intravenous, Continuous, Omie Ferger, [place] K, MD .  lidocaine (PF) (XYLOCAINE) 1 % injection 2 mL, 2 mL, Intradermal, Once, [place], [place] K, MD .  lidocaine (PF) (XYLOCAINE) 1 % injection, , , ,   Medications Prior to Admission  Medication Sig Dispense Refill Last Dose  . albuterol (PROVENTIL HFA) 108 (90 BASE) MCG/ACT inhaler Inhale 2 puffs into the lungs every 4 (four) hours as needed for wheezing or shortness of breath. 1 Inhaler 0 08/12/2016 at Unknown time  . albuterol (PROVENTIL HFA;VENTOLIN HFA) 108 (90 Base) MCG/ACT inhaler Inhale 2 puffs into the lungs every 6 (six) hours as needed for wheezing or shortness of breath. 1 Inhaler 0   . ALBUTEROL SULFATE HFA IN Inhale 1 puff into the lungs 2 (two) times daily as needed (shortness of breath or wheezing).   08/12/2016 at Unknown time  . azithromycin (ZITHROMAX) 500 MG tablet 1 tablet daily for the next 5  days 5 tablet 0   . benzonatate (TESSALON) 200 MG capsule Take 1 capsule (200 mg total) by mouth 3 (three) times daily as needed. 30 capsule 0   . omeprazole (PRILOSEC) 40 MG capsule Take 40 mg by mouth daily.   08/12/2016 at Unknown time  . polyethylene glycol powder (GLYCOLAX/MIRALAX) powder 1 cap full in a full glass of water, two times a day for 3 days. 255 g 0   . predniSONE (STERAPRED UNI-PAK 21 TAB) 10 MG (21) TBPK tablet Sig 6 tablet day 1, 5 tablets day 2, 4 tablets day 3,,3tablets day 4, 2 tablets day 5, 1 tablet day 6 take all tablets orally 21 tablet 0   . senna-docusate (SENOKOT-S) 8.6-50 MG tablet Take 2 tablets by mouth 2 (two) times daily. 120 tablet 0      Allergies  Allergen Reactions  . Flagyl [Metronidazole] Shortness Of Breath  . Aspirin Nausea And Vomiting  . Ky Plus Spermicidal [Nonoxynol 9] Other (See Comments)    Yeast Infection   . Meloxicam Hives  . Morphine And Related Hives  . Tramadol Hives     Past Medical History:  Diagnosis Date  . Asthma   . Bronchitis   . GERD (gastroesophageal reflux disease)   . Hypoglycemia     Review of systems:   SOB with exertion.   Physical Exam  Gen: Alert, oriented. Appears stated age.  HEENT: /AT. PERRLA. Lungs: CTA, no wheezes. CV: RR nl S1, S2. Abd: soft, benign, no masses. BS+ Ext: No edema. Pulses 2+    Planned procedures: EGD  and colonoscopy. The patient understands the nature of the planned procedure, indications, risks, alternatives and potential complications including but not limited to bleeding, infection, perforation, damage to internal organs and possible oversedation/side effects from anesthesia. The patient agrees and gives consent to proceed.  Please refer to procedure notes for findings, recommendations and patient disposition/instructions.    Denim Start K. Teresa Malone, M.D. Gastroenterology 10/27/2017  9:07 AM

## 2017-10-27 NOTE — Interval H&P Note (Signed)
History and Physical Interval Note:  10/27/2017 9:09 AM  Teresa Malone  has presented today for surgery, with the diagnosis of CHANGE IN [facility] DYSPHAGIA  The various methods of treatment have been discussed with the patient and family. After consideration of risks, benefits and other options for treatment, the patient has consented to  Procedure(s): COLONOSCOPY WITH PROPOFOL (N/A) ESOPHAGOGASTRODUODENOSCOPY (EGD) WITH PROPOFOL (N/A) as a surgical intervention .  The patient's history has been reviewed, patient examined, no change in status, stable for surgery.  I have reviewed the patient's chart and labs.  Questions were answered to the patient's satisfaction.     [place], [place]

## 2017-10-27 NOTE — Anesthesia Preprocedure Evaluation (Addendum)
Anesthesia Evaluation  Patient identified by MRN, date of birth, ID band Patient awake    Reviewed: Allergy & Precautions, NPO status , Patient's Chart, lab work & pertinent test results, reviewed documented beta blocker date and time   Airway Mallampati: III  TM Distance: >3 FB     Dental  (+) Edentulous Upper, Edentulous Lower   Pulmonary asthma , Current Smoker,           Cardiovascular      Neuro/Psych    GI/Hepatic GERD  ,  Endo/Other  Morbid obesity  Renal/GU      Musculoskeletal   Abdominal   Peds  Hematology   Anesthesia Other Findings   Reproductive/Obstetrics                            Anesthesia Physical Anesthesia Plan  ASA: III  Anesthesia Plan: General   Post-op Pain Management:    Induction: Intravenous  PONV Risk Score and Plan:   Airway Management Planned:   Additional Equipment:   Intra-op Plan:   Post-operative Plan:   Informed Consent: I have reviewed the patients History and Physical, chart, labs and discussed the procedure including the risks, benefits and alternatives for the proposed anesthesia with the patient or authorized representative who has indicated his/her understanding and acceptance.     Plan Discussed with: CRNA  Anesthesia Plan Comments:         Anesthesia Quick Evaluation

## 2017-10-27 NOTE — Anesthesia Post-op Follow-up Note (Signed)
Anesthesia QCDR form completed.        

## 2017-10-27 NOTE — Anesthesia Postprocedure Evaluation (Signed)
Anesthesia Post Note  Patient: ROISIN MONES  Procedure(s) Performed: COLONOSCOPY WITH PROPOFOL (N/A ) ESOPHAGOGASTRODUODENOSCOPY (EGD) WITH PROPOFOL (N/A )  Patient location during evaluation: Endoscopy Anesthesia Type: General Level of consciousness: awake and alert Pain management: pain level controlled Vital Signs Assessment: post-procedure vital signs reviewed and stable Respiratory status: spontaneous breathing, nonlabored ventilation, respiratory function stable and patient connected to nasal cannula oxygen Cardiovascular status: blood pressure returned to baseline and stable Postop Assessment: no apparent nausea or vomiting Anesthetic complications: no     Last Vitals:  Vitals:   10/27/17 1020 10/27/17 1030  BP: 124/89 116/65  Pulse: 76 71  Resp: 12 16  Temp:    SpO2: 98% 98%    Last Pain:  Vitals:   10/27/17 0852  TempSrc: Tympanic  PainSc: [place]

## 2017-10-27 NOTE — Transfer of Care (Signed)
Immediate Anesthesia Transfer of Care Note  Patient: Teresa Malone  Procedure(s) Performed: COLONOSCOPY WITH PROPOFOL (N/A ) ESOPHAGOGASTRODUODENOSCOPY (EGD) WITH PROPOFOL (N/A )  Patient Location: PACU  Anesthesia Type:General  Level of Consciousness: awake, alert  and oriented  Airway & Oxygen Therapy: Patient Spontanous Breathing and Patient connected to nasal cannula oxygen  Post-op Assessment: Report given to RN and Post -op Vital signs reviewed and stable  Post vital signs: Reviewed and stable  Last Vitals:  Vitals:   10/27/17 1020 10/27/17 1030  BP: 124/89 116/65  Pulse: 76 71  Resp: 12 16  Temp:    SpO2: 98% 98%    Last Pain:  Vitals:   10/27/17 0852  TempSrc: Tympanic  PainSc: 4          Complications: No apparent anesthesia complications

## 2017-10-27 NOTE — Op Note (Signed)
[facility] Gastroenterology Patient Name: Teresa Malone Procedure Date: 10/27/2017 9:12 AM MRN: [id number] Account #: [id number] Date of Birth: 1981-03-26 Admit Type: Outpatient Age: 37 Room: [facility] ENDO ROOM 2 Gender: Female Note Status: Finalized Procedure:            Colonoscopy Indications:          Lower abdominal pain, Functional diarrhea Providers:            Benay Pike. Ismael Karge MD, MD Medicines:            Propofol per Anesthesia Complications:        No immediate complications. Procedure:            Pre-Anesthesia Assessment:                       - The risks and benefits of the procedure and the                        sedation options and risks were discussed with the                        patient. All questions were answered and informed                        consent was obtained.                       - Patient identification and proposed procedure were                        verified prior to the procedure by the nurse. The                        procedure was verified in the procedure room.                       - ASA Grade Assessment: II - A patient with mild                        systemic disease.                       - After reviewing the risks and benefits, the patient                        was deemed in satisfactory condition to undergo the                        procedure.                       After obtaining informed consent, the colonoscope was                        passed under direct vision. Throughout the procedure,                        the patient's blood pressure, pulse, and oxygen                        saturations were monitored continuously. The  Colonoscope was introduced through the anus and                        advanced to the the terminal ileum, with identification                        of the appendiceal orifice and IC valve. The                        colonoscopy was performed without  difficulty. The                        patient tolerated the procedure well. The quality of                        the bowel preparation was good. The terminal ileum,                        ileocecal valve, appendiceal orifice, and rectum were                        photographed. Findings:      The perianal and digital rectal examinations were normal. Pertinent       negatives include normal sphincter tone and no palpable rectal lesions.      A 8 mm polyp was found in the proximal ascending colon. The polyp was       semi-pedunculated. The polyp was removed with a hot snare. Resection and       retrieval were complete.      The terminal ileum appeared normal. Biopsies were taken with a cold       forceps for histology.      Normal mucosa was found in the entire colon. Several biopsies were       obtained Right colon, left colon, terminal ileum and rectum with cold       forceps for evaluation of inflammatory bowel disease.      Non-bleeding internal hemorrhoids were found during retroflexion. The       hemorrhoids were Grade I (internal hemorrhoids that do not prolapse).      The exam was otherwise without abnormality. Impression:           - No specimens collected. Recommendation:       - Patient has a contact number available for                        emergencies. The signs and symptoms of potential                        delayed complications were discussed with the patient.                        Return to normal activities tomorrow. Written discharge                        instructions were provided to the patient.                       - High fiber diet.                       -  Continue present medications.                       - Await pathology results.                       - Repeat colonoscopy date to be determined after                        pending pathology results are reviewed for surveillance                        based on pathology results.                       -  Return to GI office in 3 months.                       - The findings and recommendations were discussed with                        the patient and their family. Procedure Code(s):    --- Professional ---                       [id number], Colonoscopy, flexible; with removal of tumor(s),                        polyp(s), or other lesion(s) by snare technique                       45380, 59, Colonoscopy, flexible; with biopsy, single                        or multiple Diagnosis Code(s):    --- Professional ---                       K59.1, Functional diarrhea                       R10.30, Lower abdominal pain, unspecified CPT copyright 2016 [employer] Medical [employer]. All rights reserved. The codes documented in this report are preliminary and upon coder review may  be revised to meet current compliance requirements. Efrain Sella MD, MD 10/27/2017 10:08:31 AM This report has been signed electronically. Number of Addenda: 0 Note Initiated On: 10/27/2017 9:12 AM Scope Withdrawal Time: 0 hours 12 minutes 24 seconds  Total Procedure Duration: 0 hours 14 minutes 39 seconds       [facility]

## 2017-10-28 ENCOUNTER — Encounter: Payer: Self-pay | Admitting: Internal Medicine

## 2017-10-29 LAB — SURGICAL PATHOLOGY

## 2017-12-17 ENCOUNTER — Other Ambulatory Visit: Payer: Self-pay | Admitting: Cardiology

## 2017-12-17 DIAGNOSIS — I10 Essential (primary) hypertension: Secondary | ICD-10-CM

## 2017-12-17 DIAGNOSIS — R0602 Shortness of breath: Secondary | ICD-10-CM

## 2017-12-17 DIAGNOSIS — R0789 Other chest pain: Secondary | ICD-10-CM

## 2017-12-23 ENCOUNTER — Other Ambulatory Visit: Payer: Self-pay | Admitting: Cardiology

## 2017-12-23 ENCOUNTER — Other Ambulatory Visit: Payer: Self-pay

## 2017-12-23 ENCOUNTER — Emergency Department: Payer: Medicaid Other

## 2017-12-23 ENCOUNTER — Ambulatory Visit
Admission: RE | Admit: 2017-12-23 | Discharge: 2017-12-23 | Disposition: A | Discharge status: Home / Self Care | Payer: Medicaid Other | Source: Ambulatory Visit | Attending: Cardiology | Admitting: Cardiology

## 2017-12-23 ENCOUNTER — Emergency Department
Admission: EM | Admit: 2017-12-23 | Discharge: 2017-12-24 | Disposition: A | Discharge status: Home / Self Care | Payer: Medicaid Other | Attending: Emergency Medicine | Admitting: Emergency Medicine

## 2017-12-23 ENCOUNTER — Encounter
Admission: RE | Admit: 2017-12-23 | Discharge: 2017-12-23 | Disposition: A | Discharge status: Home / Self Care | Payer: Medicaid Other | Source: Ambulatory Visit | Attending: Cardiology | Admitting: Cardiology

## 2017-12-23 DIAGNOSIS — J45909 Unspecified asthma, uncomplicated: Secondary | ICD-10-CM | POA: Insufficient documentation

## 2017-12-23 DIAGNOSIS — I10 Essential (primary) hypertension: Secondary | ICD-10-CM

## 2017-12-23 DIAGNOSIS — R0602 Shortness of breath: Secondary | ICD-10-CM | POA: Insufficient documentation

## 2017-12-23 DIAGNOSIS — R0789 Other chest pain: Secondary | ICD-10-CM | POA: Diagnosis not present

## 2017-12-23 DIAGNOSIS — Z79899 Other long term (current) drug therapy: Secondary | ICD-10-CM | POA: Insufficient documentation

## 2017-12-23 DIAGNOSIS — F17210 Nicotine dependence, cigarettes, uncomplicated: Secondary | ICD-10-CM | POA: Diagnosis not present

## 2017-12-23 HISTORY — DX: Morbid (severe) obesity due to excess calories: E66.01

## 2017-12-23 HISTORY — DX: Other chest pain: R07.89

## 2017-12-23 LAB — BASIC METABOLIC PANEL
Anion gap: 8 (ref 5–15)
BUN: 12 mg/dL (ref 6–20)
CO2: 25 mmol/L (ref 22–32)
Calcium: 9.1 mg/dL (ref 8.9–10.3)
Chloride: 104 mmol/L (ref 101–111)
Creatinine, Ser: 0.65 mg/dL (ref 0.44–1.00)
GFR calc Af Amer: >60 mL/min (ref >60)
GFR calc non Af Amer: >60 mL/min (ref >60)
GLUCOSE: 103 mg/dL — AB (ref 65–99)
Potassium: 3.9 mmol/L (ref 3.5–5.1)
Sodium: 137 mmol/L (ref 135–145)

## 2017-12-23 LAB — TROPONIN I: Troponin I: <0.03 ng/mL (ref <0.03)

## 2017-12-23 LAB — CBC
HEMATOCRIT: 41.7 % (ref 35.0–47.0)
HEMOGLOBIN: 13.9 g/dL (ref 12.0–16.0)
MCH: 28.6 pg (ref 26.0–34.0)
MCHC: 33.4 g/dL (ref 32.0–36.0)
MCV: 85.7 fL (ref 80.0–100.0)
Platelets: 275 10*3/uL (ref 150–440)
RBC: 4.86 MIL/uL (ref 3.80–5.20)
RDW: 15.2 % — ABNORMAL HIGH (ref 11.5–14.5)
WBC: 14.5 10*3/uL — AB (ref 3.6–11.0)

## 2017-12-23 LAB — POCT PREGNANCY, URINE: Preg Test, Ur: NEGATIVE

## 2017-12-23 MED ORDER — TECHNETIUM TC 99M TETROFOSMIN IV KIT
32.5460 | PACK | Freq: Once | INTRAVENOUS | Status: AC | PRN
  Administered 2017-12-23: 32.546 via INTRAVENOUS

## 2017-12-23 MED ORDER — REGADENOSON 0.4 MG/5ML IV SOLN
0.4000 mg | Freq: Once | INTRAVENOUS | Status: AC
  Administered 2017-12-23: 0.4 mg via INTRAVENOUS
  Filled 2017-12-23: qty 5

## 2017-12-23 NOTE — ED Provider Notes (Signed)
[facility] Emergency Department Provider Note  ____________________________________________   First MD Initiated Contact with Patient 12/23/17 2306     (approximate)  I have reviewed the triage vital signs and the nursing notes.   HISTORY  Chief Complaint Chest Pain    HPI Teresa Malone is a 37 y.o. female with medical history as listed below who presents for evaluation of worsening chest pain today after having a medical stress test earlier today by Dr. Ubaldo Glassing.  She reports that she has had intermittent central aching and heavy chest pain/pressure over the last month.  She followed up with Dr. Ubaldo Glassing and had a stress test and an echocardiogram today.  She was told that both results were normal and she does not even need to come back tomorrow for the rest of the test.  However she reports that after getting "the medicine" earlier today for the test her chest pain has been worse.  She describes it as severe earlier tonight although now it is back to mild.  Nothing in particular makes it better or worse.  She says she has had panic attacks in the past but this feels different.  She denies shortness of breath, fever/chills, cough, vomiting, abdominal pain, and dysuria.  She sometimes has some intermittent nausea.  She has not had any long trips recently, no immobilizations, no surgery.  She has an IUD but takes no estrogen supplements.  She has had no unilateral leg swelling or pain.  She has no history of blood clot in her legs or lungs.  Past Medical History:  Diagnosis Date  . Asthma   . Atypical chest pain   . Bronchitis   . GERD (gastroesophageal reflux disease)   . Hypoglycemia   . Morbid obesity ([place])     There are no active problems to display for this patient.   Past Surgical History:  Procedure Laterality Date  . CHOLECYSTECTOMY    . COLONOSCOPY WITH PROPOFOL N/A 10/27/2017   Procedure: COLONOSCOPY WITH PROPOFOL;  Surgeon: Toledo, Benay Pike, MD;   Location: [facility] ENDOSCOPY;  Service: Gastroenterology;  Laterality: N/A;  . ESOPHAGOGASTRODUODENOSCOPY (EGD) WITH PROPOFOL N/A 10/27/2017   Procedure: ESOPHAGOGASTRODUODENOSCOPY (EGD) WITH PROPOFOL;  Surgeon: Toledo, Benay Pike, MD;  Location: [facility] ENDOSCOPY;  Service: Gastroenterology;  Laterality: N/A;    Prior to Admission medications   Medication Sig Start Date End Date Taking? Authorizing Provider  albuterol (PROVENTIL HFA) 108 (90 BASE) MCG/ACT inhaler Inhale 2 puffs into the lungs every 4 (four) hours as needed for wheezing or shortness of breath. 01/10/15   Carrie Mew, MD  albuterol (PROVENTIL HFA;VENTOLIN HFA) 108 (90 Base) MCG/ACT inhaler Inhale 2 puffs into the lungs every 6 (six) hours as needed for wheezing or shortness of breath. 08/12/16   Frederich Cha, MD  ALBUTEROL SULFATE HFA IN Inhale 1 puff into the lungs 2 (two) times daily as needed (shortness of breath or wheezing).    [provider]  azithromycin (ZITHROMAX) 500 MG tablet 1 tablet daily for the next 5 days 08/12/16   Frederich Cha, MD  benzonatate (TESSALON) 200 MG capsule Take 1 capsule (200 mg total) by mouth 3 (three) times daily as needed. 08/12/16   Frederich Cha, MD  omeprazole (PRILOSEC) 40 MG capsule Take 40 mg by mouth daily.    [provider]  polyethylene glycol powder (GLYCOLAX/MIRALAX) powder 1 cap full in a full glass of water, two times a day for 3 days. 05/08/17   Carrie Mew, MD  predniSONE (STERAPRED UNI-PAK 21 TAB) 10 MG (21) TBPK tablet Sig 6 tablet day 1, 5 tablets day 2, 4 tablets day 3,,3tablets day 4, 2 tablets day 5, 1 tablet day 6 take all tablets orally 08/12/16   Frederich Cha, MD  senna-docusate (SENOKOT-S) 8.6-50 MG tablet Take 2 tablets by mouth 2 (two) times daily. 05/08/17   Carrie Mew, MD    Allergies Flagyl [metronidazole]; Aspirin; Ky plus spermicidal [nonoxynol 9]; Meloxicam; Morphine and related; and Tramadol  Family History  Problem Relation Age of Onset  .  COPD Mother     Social History Social History   Tobacco Use  . Smoking status: Current Every Day Smoker    Packs/day: 0.50    Types: Cigarettes  . Smokeless tobacco: Never Used  Substance Use Topics  . Alcohol use: No  . Drug use: No    Review of Systems Constitutional: No fever/chills Eyes: No visual changes. ENT: No sore throat. Cardiovascular: Chest pain and pressure for about a month as described above, worse today, now improved Respiratory: Denies shortness of breath. Gastrointestinal: No abdominal pain.  No nausea, no vomiting.  No diarrhea.  No constipation. Genitourinary: Negative for dysuria. Musculoskeletal: Negative for neck pain.  Negative for back pain. Integumentary: Negative for rash. Neurological: Negative for headaches, focal weakness or numbness.   ____________________________________________   PHYSICAL EXAM:  VITAL SIGNS: ED Triage Vitals  Enc Vitals Group     BP 12/23/17 1930 (!) 125/51     Pulse Rate 12/23/17 1930 88     Resp 12/23/17 1930 18     Temp 12/23/17 1930 98.3 F (36.8 C)     Temp Source 12/23/17 1930 Oral     SpO2 12/23/17 1930 98 %     Weight 12/23/17 1937 (!) 149.7 kg (330 lb)     Height 12/23/17 1937 1.676 m (5\' 6" )     Head Circumference --      Peak Flow --      Pain Score 12/23/17 1937 6     Pain Loc --      Pain Edu? --      Excl. in [place]? --     Constitutional: Alert and oriented. Well appearing and in no acute distress. Eyes: Conjunctivae are normal.  Head: Atraumatic. Nose: No congestion/rhinnorhea. Neck: No stridor.  No meningeal signs.   Cardiovascular: Normal rate, regular rhythm. Good peripheral circulation. Grossly normal heart sounds.  No reproducible chest wall tenderness Respiratory: Normal respiratory effort.  No retractions. Lungs CTAB. Gastrointestinal: Obese.  Soft and nontender. No distention.  Musculoskeletal: No lower extremity tenderness nor edema. No gross deformities of extremities. Neurologic:   Normal speech and language. No gross focal neurologic deficits are appreciated.  Skin:  Skin is warm, dry and intact. No rash noted. Psychiatric: Mood and affect are normal. Speech and behavior are normal.  ____________________________________________   LABS (all labs ordered are listed, but only abnormal results are displayed)  Labs Reviewed  BASIC METABOLIC PANEL - Abnormal; Notable for the following components:      Result Value   Glucose, Bld 103 (*)    All other components within normal limits  CBC - Abnormal; Notable for the following components:   WBC 14.5 (*)    RDW 15.2 (*)    All other components within normal limits  TROPONIN I  POC URINE PREG, ED  POCT PREGNANCY, URINE   ____________________________________________  EKG  ED ECG REPORT I, Hinda Kehr, the attending physician, personally viewed and interpreted  this ECG.  Date: 12/23/2017 EKG Time: 19:38 Rate: 85 Rhythm: normal sinus rhythm QRS Axis: normal Intervals: normal ST/T Wave abnormalities: Inverted T wave in lead III, V3, otherwise unremarkable Narrative Interpretation: no evidence of acute ischemia.  When compared to EKG from 5 years ago, the patient had inverted T waves in lead III at that time but there was no appreciable T wave in lead V3 against which to compare.  ____________________________________________  RADIOLOGY I, Hinda Kehr, personally viewed and evaluated these images (plain radiographs) as part of my medical decision making, as well as reviewing the written report by the radiologist.  ED MD interpretation: Some bronchitic changes but no acute abnormalities and no acute explanation of her ongoing  subacute chest pain   Official radiology report(s): Dg Chest 2 View  Result Date: 12/23/2017 CLINICAL DATA:  Chest pain EXAM: CHEST - 2 VIEW COMPARISON:  01/29/2015 FINDINGS: Prominent interstitial markings, likely bronchitic in this patient with history of asthma and bronchitis. No  collapse or consolidation. No Kerley lines or effusion. No pneumothorax. Normal heart size and mediastinal contours. IMPRESSION: Bronchitic markings. Electronically Signed   By: Monte Fantasia M.D.   On: 12/23/2017 20:20    ____________________________________________   PROCEDURES  Critical Care performed: No   Procedure(s) performed:   Procedures   ____________________________________________   INITIAL IMPRESSION / ASSESSMENT AND PLAN / ED COURSE  As part of my medical decision making, I reviewed the following data within the [place] notes reviewed and incorporated, Labs reviewed , EKG interpreted , Old EKG reviewed, Old chart reviewed and Radiograph reviewed     Differential diagnosis includes, but is not limited to, ACS, aortic dissection, pulmonary embolism, cardiac tamponade, pneumothorax, pneumonia, pericarditis, myocarditis, GI-related causes including esophagitis/gastritis, and musculoskeletal chest wall pain.     This patient has had an extensive outpatient work-up including today with a Myoview and echocardiogram.  I cannot see the results of the stress test but the echocardiogram results were reassuring.  In spite of the stress test earlier today her labs are normal tonight with a negative troponin, normal metabolic panel, negative urine pregnancy, normal vital signs, and a CBC only notable for a mild leukocytosis that is nonspecific.  No evidence of acute ischemia on EKG, no significant change from prior EKG on record.  The patient is PERC negative with a low pretest probability and there is no indication for testing a d-dimer at this point based on her low risk.   I discussed with the patient her reassuring results and the need for ongoing outpatient work-up but the fact that there was nothing else to test tonight in the emergency department.  I brought up the possibility of GI causes such as acid reflux as a reason for her pain but she does not  think that can be the case and already takes medication for acid reflux.  I also brought up the possibility of panic attacks or anxiety and she did agree that could be possible.  I encourage close outpatient follow-up with Dr. Ubaldo Glassing.  I gave my usual and customary return precautions.  She understands and agrees with the plan.  She is tolerating p.o. intake in the emergency department with no difficulty.     ____________________________________________  FINAL CLINICAL IMPRESSION(S) / ED DIAGNOSES  Final diagnoses:  Atypical chest pain     MEDICATIONS GIVEN DURING THIS VISIT:  Medications - No data to display   ED Discharge Orders    None  Note:  This document was prepared using Dragon voice recognition software and may include unintentional dictation errors.    Hinda Kehr, MD 12/23/17 2342

## 2017-12-23 NOTE — ED Triage Notes (Signed)
Pt arives to ED with centralized c/o chest pain with radiation into the bilateral mid-back. Pt reports having a "stress test today" and was told everything was fine. Pt states s/x's have been "going off and on for a month or more", and states the "pain today didn't start again until they gave me the medicine for the test". Pt reports some nausea, but denies vomiting.. Pt denies any previous cardiac h/x. Pt is A&O, in NAD; RR even, regular, and unlabored.

## 2017-12-23 NOTE — Progress Notes (Signed)
*  PRELIMINARY RESULTS* Echocardiogram 2D Echocardiogram has been performed.  Teresa Malone 12/23/2017, 10:00 AM

## 2017-12-23 NOTE — ED Notes (Signed)
Pt uprite on stretcher in exam room with no distress noted; card monitor placed on pt; pt reports upper CP "pressure" radiating into back x month accomp by some SHOB; had myoview today (Dr Ubaldo Glassing) and pt reports pain seemed worse with procedure and since; resp even/unlab, lungs clear, apical audible & regular, +BS, abd soft/nondist

## 2017-12-23 NOTE — ED Notes (Signed)
ED Provider at bedside. 

## 2017-12-23 NOTE — Discharge Instructions (Signed)
Your workup in the Emergency Department today was reassuring.  We did not find any specific abnormalities.  We recommend you drink plenty of fluids, take your regular medications and/or any new ones prescribed today, and follow up with the doctor(s) listed in these documents as recommended.  Return to the Emergency Department if you develop new or worsening symptoms that concern you.  

## 2017-12-24 ENCOUNTER — Encounter: Admission: RE | Admit: 2017-12-24 | Payer: Medicaid Other | Source: Ambulatory Visit

## 2017-12-24 LAB — NM MYOCAR SINGLE W/SPECT
CHL CUP MPHR: 183 {beats}/min
CHL CUP RESTING HR STRESS: 66 {beats}/min
CSEPED: 1 min
CSEPEW: 1 METS
CSEPHR: 54 %
Exercise duration (sec): 0 s
LV sys vol: 75 mL
LVDIAVOL: 146 mL (ref 46–106)
Peak HR: 100 {beats}/min
SSS: 4

## 2017-12-24 NOTE — ED Notes (Signed)
Patient discharged to home per MD order. Patient in stable condition, and deemed medically cleared by ED provider for discharge. Discharge instructions reviewed with patient/family using "Teach Back"; verbalized understanding of medication education and administration, and information about follow-up care. Denies further concerns. ° °

## 2018-03-09 ENCOUNTER — Encounter: Payer: Self-pay | Admitting: Emergency Medicine

## 2018-03-09 ENCOUNTER — Other Ambulatory Visit: Payer: Self-pay

## 2018-03-09 DIAGNOSIS — M5432 Sciatica, left side: Secondary | ICD-10-CM | POA: Diagnosis not present

## 2018-03-09 DIAGNOSIS — R1032 Left lower quadrant pain: Secondary | ICD-10-CM | POA: Diagnosis present

## 2018-03-09 DIAGNOSIS — J45909 Unspecified asthma, uncomplicated: Secondary | ICD-10-CM | POA: Insufficient documentation

## 2018-03-09 DIAGNOSIS — Z79899 Other long term (current) drug therapy: Secondary | ICD-10-CM | POA: Diagnosis not present

## 2018-03-09 DIAGNOSIS — F17210 Nicotine dependence, cigarettes, uncomplicated: Secondary | ICD-10-CM | POA: Insufficient documentation

## 2018-03-09 LAB — URINALYSIS, COMPLETE (UACMP) WITH MICROSCOPIC
BILIRUBIN URINE: NEGATIVE
Glucose, UA: NEGATIVE mg/dL
KETONES UR: NEGATIVE mg/dL
Nitrite: NEGATIVE
PROTEIN: 30 mg/dL — AB
SPECIFIC GRAVITY, URINE: 1.027 (ref 1.005–1.030)
pH: 5 (ref 5.0–8.0)

## 2018-03-09 LAB — CBC
HEMATOCRIT: 40.5 % (ref 35.0–47.0)
Hemoglobin: 13.8 g/dL (ref 12.0–16.0)
MCH: 29.7 pg (ref 26.0–34.0)
MCHC: 34.1 g/dL (ref 32.0–36.0)
MCV: 87 fL (ref 80.0–100.0)
Platelets: 288 10*3/uL (ref 150–440)
RBC: 4.66 MIL/uL (ref 3.80–5.20)
RDW: 14.8 % — ABNORMAL HIGH (ref 11.5–14.5)
WBC: 13.7 10*3/uL — AB (ref 3.6–11.0)

## 2018-03-09 LAB — COMPREHENSIVE METABOLIC PANEL
ALBUMIN: 3.8 g/dL (ref 3.5–5.0)
ALT: 22 U/L (ref 0–44)
AST: 31 U/L (ref 15–41)
Alkaline Phosphatase: 95 U/L (ref 38–126)
Anion gap: 9 (ref 5–15)
BUN: 11 mg/dL (ref 6–20)
CHLORIDE: 106 mmol/L (ref 98–111)
CO2: 25 mmol/L (ref 22–32)
Calcium: 9.1 mg/dL (ref 8.9–10.3)
Creatinine, Ser: 0.62 mg/dL (ref 0.44–1.00)
GFR calc Af Amer: >60 mL/min (ref >60)
Glucose, Bld: 165 mg/dL — ABNORMAL HIGH (ref 70–99)
POTASSIUM: 3.8 mmol/L (ref 3.5–5.1)
Sodium: 140 mmol/L (ref 135–145)
Total Bilirubin: 0.5 mg/dL (ref 0.3–1.2)
Total Protein: 7.2 g/dL (ref 6.5–8.1)

## 2018-03-09 LAB — POCT PREGNANCY, URINE: PREG TEST UR: NEGATIVE

## 2018-03-09 LAB — LIPASE, BLOOD: LIPASE: 43 U/L (ref 11–51)

## 2018-03-09 NOTE — ED Triage Notes (Signed)
Suprapubic pain today sharp pain radiating to bilateral hips. Currently on her period, with known cyst on ovaries

## 2018-03-10 ENCOUNTER — Emergency Department: Payer: Medicaid Other

## 2018-03-10 ENCOUNTER — Emergency Department
Admission: EM | Admit: 2018-03-10 | Discharge: 2018-03-10 | Disposition: A | Discharge status: Home / Self Care | Payer: Medicaid Other | Attending: Emergency Medicine | Admitting: Emergency Medicine

## 2018-03-10 DIAGNOSIS — R1032 Left lower quadrant pain: Secondary | ICD-10-CM

## 2018-03-10 DIAGNOSIS — M5430 Sciatica, unspecified side: Secondary | ICD-10-CM

## 2018-03-10 MED ORDER — ACETAMINOPHEN 500 MG PO TABS
1000.0000 mg | ORAL_TABLET | Freq: Once | ORAL | Status: DC
  Filled 2018-03-10: qty 2

## 2018-03-10 MED ORDER — IBUPROFEN 600 MG PO TABS
600.0000 mg | ORAL_TABLET | Freq: Once | ORAL | Status: AC
  Administered 2018-03-10: 600 mg via ORAL
  Filled 2018-03-10: qty 1

## 2018-03-10 MED ORDER — PREDNISONE 20 MG PO TABS
60.0000 mg | ORAL_TABLET | ORAL | Status: AC
  Administered 2018-03-10: 60 mg via ORAL
  Filled 2018-03-10: qty 3

## 2018-03-10 MED ORDER — PREDNISONE 10 MG PO TABS: ORAL_TABLET | ORAL | 0 refills | Status: DC

## 2018-03-10 MED ORDER — HYDROCODONE-ACETAMINOPHEN 5-325 MG PO TABS: 1.0000 | ORAL_TABLET | ORAL | 0 refills | Status: DC | PRN

## 2018-03-10 MED ORDER — CYCLOBENZAPRINE HCL 5 MG PO TABS: 5.0000 mg | ORAL_TABLET | Freq: Three times a day (TID) | ORAL | 0 refills | Status: DC | PRN

## 2018-03-10 NOTE — ED Provider Notes (Signed)
[facility] Emergency Department Provider Note  ____________________________________________   First MD Initiated Contact with Patient 03/10/18 0117     (approximate)  I have reviewed the triage vital signs and the nursing notes.   HISTORY  Chief Complaint Abdominal Pain    HPI Teresa Malone is a 37 y.o. female with medical history as listed below who presents for evaluation of pain that she describes as being in the left lower quadrant of her abdomen.  She is finishing up her menstrual cycle and has had issues with ovarian cyst before but she says this is different.  She has sharp stabbing pain in her left lower quadrant that she says is radiating down her left leg and making it difficult for her to move her leg.  She has a history of sciatica and low back problems but says this is different and feels like it is more in the front of her abdomen and leg.  Nothing in particular makes it better and moving and weightbearing makes it worse.  She denies fever/chills, chest pain, shortness of breath.  She has had nausea but no vomiting.  She denies diarrhea and constipation.  She has not had any dysuria nor hematuria and has no history of kidney stones.  She does not have weakness in her left lower extremity just pain.  Past Medical History:  Diagnosis Date  . Asthma   . Atypical chest pain   . Bronchitis   . GERD (gastroesophageal reflux disease)   . Hypoglycemia   . Morbid obesity ([place])     There are no active problems to display for this patient.   Past Surgical History:  Procedure Laterality Date  . CHOLECYSTECTOMY    . COLONOSCOPY WITH PROPOFOL N/A 10/27/2017   Procedure: COLONOSCOPY WITH PROPOFOL;  Surgeon: Toledo, Benay Pike, MD;  Location: [facility] ENDOSCOPY;  Service: Gastroenterology;  Laterality: N/A;  . ESOPHAGOGASTRODUODENOSCOPY (EGD) WITH PROPOFOL N/A 10/27/2017   Procedure: ESOPHAGOGASTRODUODENOSCOPY (EGD) WITH PROPOFOL;  Surgeon: Toledo, Benay Pike, MD;  Location: [facility] ENDOSCOPY;  Service: Gastroenterology;  Laterality: N/A;    Prior to Admission medications   Medication Sig Start Date End Date Taking? Authorizing Provider  albuterol (PROVENTIL HFA) 108 (90 BASE) MCG/ACT inhaler Inhale 2 puffs into the lungs every 4 (four) hours as needed for wheezing or shortness of breath. 01/10/15   Carrie Mew, MD  albuterol (PROVENTIL HFA;VENTOLIN HFA) 108 (90 Base) MCG/ACT inhaler Inhale 2 puffs into the lungs every 6 (six) hours as needed for wheezing or shortness of breath. 08/12/16   Frederich Cha, MD  ALBUTEROL SULFATE HFA IN Inhale 1 puff into the lungs 2 (two) times daily as needed (shortness of breath or wheezing).    [provider]  azithromycin (ZITHROMAX) 500 MG tablet 1 tablet daily for the next 5 days 08/12/16   Frederich Cha, MD  benzonatate (TESSALON) 200 MG capsule Take 1 capsule (200 mg total) by mouth 3 (three) times daily as needed. 08/12/16   Frederich Cha, MD  cyclobenzaprine (FLEXERIL) 5 MG tablet Take 1 tablet (5 mg total) by mouth 3 (three) times daily as needed for muscle spasms. 03/10/18   Hinda Kehr, MD  HYDROcodone-acetaminophen (NORCO/VICODIN) 5-325 MG tablet Take 1-2 tablets by mouth every 4 (four) hours as needed for moderate pain. 03/10/18   Hinda Kehr, MD  omeprazole (PRILOSEC) 40 MG capsule Take 40 mg by mouth daily.    [provider]  polyethylene glycol powder (GLYCOLAX/MIRALAX) powder 1 cap full in a  full glass of water, two times a day for 3 days. 05/08/17   Carrie Mew, MD  predniSONE (DELTASONE) 10 MG tablet Take 6 tabs (60 mg) PO x 3 days, then take 4 tabs (40 mg) PO x 3 days, then take 2 tabs (20 mg) PO x 3 days, then take 1 tab (10 mg) PO x 3 days, then take 1/2 tab (5 mg) PO x 4 days. 03/10/18   Hinda Kehr, MD  senna-docusate (SENOKOT-S) 8.6-50 MG tablet Take 2 tablets by mouth 2 (two) times daily. 05/08/17   Carrie Mew, MD    Allergies Flagyl [metronidazole]; Aspirin; Ky plus  spermicidal [nonoxynol 9]; Meloxicam; Morphine and related; and Tramadol  Family History  Problem Relation Age of Onset  . COPD Mother     Social History Social History   Tobacco Use  . Smoking status: Current Every Day Smoker    Packs/day: 0.50    Types: Cigarettes  . Smokeless tobacco: Never Used  Substance Use Topics  . Alcohol use: No  . Drug use: No    Review of Systems Constitutional: No fever/chills Eyes: No visual changes. ENT: No sore throat. Cardiovascular: Denies chest pain. Respiratory: Denies shortness of breath. Gastrointestinal: Left lower quadrant abdominal pain radiating down the left leg as described above with nausea but no vomiting, no diarrhea, no constipation Genitourinary: Negative for dysuria. Musculoskeletal: Negative for neck pain.  Some chronic low back pain. Integumentary: Negative for rash. Neurological: Negative for headaches, focal weakness or numbness.   ____________________________________________   PHYSICAL EXAM:  VITAL SIGNS: ED Triage Vitals  Enc Vitals Group     BP 03/09/18 2323 (!) 99/51     Pulse Rate 03/09/18 2323 90     Resp 03/09/18 2323 16     Temp 03/09/18 2323 98.5 F (36.9 C)     Temp Source 03/09/18 2323 Oral     SpO2 03/09/18 2323 96 %     Weight 03/09/18 2316 (!) 145.2 kg (320 lb)     Height 03/09/18 2316 1.676 m (5\' 6" )     Head Circumference --      Peak Flow --      Pain Score 03/09/18 2315 6     Pain Loc --      Pain Edu? --      Excl. in [place]? --     Constitutional: Alert and oriented. Well appearing and in no acute distress.  Appears older than chronological age. Eyes: Conjunctivae are normal.  Head: Atraumatic. Nose: No congestion/rhinnorhea. Mouth/Throat: Mucous membranes are moist. Neck: No stridor.  No meningeal signs.   Cardiovascular: Normal rate, regular rhythm. Good peripheral circulation. Grossly normal heart sounds. Respiratory: Normal respiratory effort.  No retractions. Lungs  CTAB. Gastrointestinal: Morbid obesity.  Soft and nondistended.  Tenderness to palpation only of the left lower quadrant. Musculoskeletal: Patient is reporting pain in her left leg whenever she moves but she has normal strength.  I performed a Babinski test and she had no problems jerking her leg away and moving it rapidly and then said that it hurt to move it so quickly.  She also has tenderness to palpation and percussion of the left flank but without any tenderness to palpation of the lumbar spine and no evidence of any rash, fluctuance, nor induration Neurologic:  Normal speech and language. No gross focal neurologic deficits are appreciated.  Skin:  Skin is warm, dry and intact. No rash noted. Psychiatric: Mood and affect are normal. Speech and behavior are normal.  ____________________________________________   LABS (all labs ordered are listed, but only abnormal results are displayed)  Labs Reviewed  COMPREHENSIVE METABOLIC PANEL - Abnormal; Notable for the following components:      Result Value   Glucose, Bld 165 (*)    All other components within normal limits  CBC - Abnormal; Notable for the following components:   WBC 13.7 (*)    RDW 14.8 (*)    All other components within normal limits  URINALYSIS, COMPLETE (UACMP) WITH MICROSCOPIC - Abnormal; Notable for the following components:   Color, Urine YELLOW (*)    APPearance HAZY (*)    Hgb urine dipstick SMALL (*)    Protein, ur 30 (*)    Leukocytes, UA SMALL (*)    Bacteria, UA RARE (*)    All other components within normal limits  LIPASE, BLOOD  POC URINE PREG, ED  POCT PREGNANCY, URINE   ____________________________________________  EKG  None - EKG not ordered by ED physician ____________________________________________  RADIOLOGY   ED MD interpretation:  Chronic lumbar bulging discs and canal narrowing.  No acute abnormalities on CT abd/pelvis, patient incidentally has 3.3-cm right sided ovarian  cyst.  Official radiology report(s): Ct L-spine No Charge  Result Date: 03/10/2018 CLINICAL DATA:  Initial evaluation for suprapubic pain radiating into the bilateral hips. EXAM: CT ABDOMEN AND PELVIS WITHOUT CONTRAST CT LUMBAR SPINE WITHOUT CONTRAST TECHNIQUE: Multidetector CT imaging of the abdomen and pelvis was performed following the standard protocol without IV contrast. COMPARISON:  Prior CT from 02/02/2017. FINDINGS: CT ABDOMEN AND PELVIS Lower chest: Minimal subsegmental atelectatic changes present within the visualized lung bases. Visualized lungs are otherwise clear. Hepatobiliary: Diffuse hypoattenuation liver compatible with steatosis. Liver otherwise unremarkable. Gallbladder surgically absent. No biliary dilatation. Pancreas: Pancreas within normal limits. Spleen: Unremarkable. Adrenals/Urinary Tract: Adrenal glands within normal limits. Kidneys equal in size without evidence for nephrolithiasis or hydronephrosis. No radiopaque calculi seen along the course of either renal collecting system. No hydroureter. Partially distended bladder within normal limits. No layering stones within the bladder lumen. Stomach/Bowel: Stomach within normal limits. No evidence for bowel obstruction. Normal appendix. No acute inflammatory changes seen about the bowels. Vascular/Lymphatic: Intra-abdominal aorta of normal caliber. No adenopathy. Reproductive: IUD in place within the uterus. Left ovary unremarkable. 3.3 cm right adnexal cyst, most likely an ovarian cyst. Other: No free air or fluid. Musculoskeletal: No acute osseus abnormality. No worrisome lytic or blastic osseous lesions. CT LUMBAR SPINE Segmentation: Normal segmentation. Lowest well-formed disc labeled the L5-S1 level. Alignment: Trace retrolisthesis of L5 on S1. Alignment otherwise normal with preservation of the normal lumbar lordosis. Vertebrae: Vertebral body heights well maintained without evidence for acute or chronic fracture. By visualized  sacrum and pelvis intact. SI joints approximated and symmetric. No discrete lytic or blastic osseous lesions. Paraspinal and other soft tissues: Paraspinous soft tissues within normal limits. Disc levels: L1-2:  Normal interspace.  Mild facet hypertrophy.  No stenosis. L2-3:  Normal interspace.  Mild facet hypertrophy.  No stenosis. L3-4: Mild diffuse disc bulge. Superimposed central disc protrusion indents the ventral thecal sac. Mild to moderate facet hypertrophy. Resultant moderate canal with moderate to severe bilateral lateral recess narrowing. Mild bilateral foraminal stenosis. L4-5: Chronic intervertebral disc space narrowing with diffuse disc bulge and disc desiccation. Posterior endplate osseous spurring. Bilateral facet hypertrophy. Resultant severe canal stenosis. Moderate to severe bilateral L4 foraminal narrowing, right worse than left. L5-S1: Negative interspace. Mild facet hypertrophy. No significant stenosis. IMPRESSION: CT ABDOMEN AND PELVIS IMPRESSION 1. No  CT evidence for nephrolithiasis or obstructive uropathy. 2. 3.3 cm right adnexal cyst, most consistent with a normal physiologic ovarian cyst. 3. No other acute intra-abdominal or pelvic process. 4. Hepatic steatosis. CT LUMBAR SPINE IMPRESSION 1. No acute abnormality within the lumbar spine. 2. 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 3. Disc bulge with facet hypertrophy at L3-4 with resultant moderate to advanced canal and bilateral subarticular stenosis. Electronically Signed   By: Jeannine Boga M.D.   On: 03/10/2018 04:23   Ct Renal Stone Study  Result Date: 03/10/2018 CLINICAL DATA:  Initial evaluation for suprapubic pain radiating into the bilateral hips. EXAM: CT ABDOMEN AND PELVIS WITHOUT CONTRAST CT LUMBAR SPINE WITHOUT CONTRAST TECHNIQUE: Multidetector CT imaging of the abdomen and pelvis was performed  following the standard protocol without IV contrast. COMPARISON:  Prior CT from 02/02/2017. FINDINGS: CT ABDOMEN AND PELVIS Lower chest: Minimal subsegmental atelectatic changes present within the visualized lung bases. Visualized lungs are otherwise clear. Hepatobiliary: Diffuse hypoattenuation liver compatible with steatosis. Liver otherwise unremarkable. Gallbladder surgically absent. No biliary dilatation. Pancreas: Pancreas within normal limits. Spleen: Unremarkable. Adrenals/Urinary Tract: Adrenal glands within normal limits. Kidneys equal in size without evidence for nephrolithiasis or hydronephrosis. No radiopaque calculi seen along the course of either renal collecting system. No hydroureter. Partially distended bladder within normal limits. No layering stones within the bladder lumen. Stomach/Bowel: Stomach within normal limits. No evidence for bowel obstruction. Normal appendix. No acute inflammatory changes seen about the bowels. Vascular/Lymphatic: Intra-abdominal aorta of normal caliber. No adenopathy. Reproductive: IUD in place within the uterus. Left ovary unremarkable. 3.3 cm right adnexal cyst, most likely an ovarian cyst. Other: No free air or fluid. Musculoskeletal: No acute osseus abnormality. No worrisome lytic or blastic osseous lesions. CT LUMBAR SPINE Segmentation: Normal segmentation. Lowest well-formed disc labeled the L5-S1 level. Alignment: Trace retrolisthesis of L5 on S1. Alignment otherwise normal with preservation of the normal lumbar lordosis. Vertebrae: Vertebral body heights well maintained without evidence for acute or chronic fracture. By visualized sacrum and pelvis intact. SI joints approximated and symmetric. No discrete lytic or blastic osseous lesions. Paraspinal and other soft tissues: Paraspinous soft tissues within normal limits. Disc levels: L1-2:  Normal interspace.  Mild facet hypertrophy.  No stenosis. L2-3:  Normal interspace.  Mild facet hypertrophy.  No stenosis.  L3-4: Mild diffuse disc bulge. Superimposed central disc protrusion indents the ventral thecal sac. Mild to moderate facet hypertrophy. Resultant moderate canal with moderate to severe bilateral lateral recess narrowing. Mild bilateral foraminal stenosis. L4-5: Chronic intervertebral disc space narrowing with diffuse disc bulge and disc desiccation. Posterior endplate osseous spurring. Bilateral facet hypertrophy. Resultant severe canal stenosis. Moderate to severe bilateral L4 foraminal narrowing, right worse than left. L5-S1: Negative interspace. Mild facet hypertrophy. No significant stenosis. IMPRESSION: CT ABDOMEN AND PELVIS IMPRESSION 1. No CT evidence for nephrolithiasis or obstructive uropathy. 2. 3.3 cm right adnexal cyst, most consistent with a normal physiologic ovarian cyst. 3. No other acute intra-abdominal or pelvic process. 4. Hepatic steatosis. CT LUMBAR SPINE IMPRESSION 1. No acute abnormality within the lumbar spine. 2. 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 3. Disc bulge with facet hypertrophy at L3-4 with resultant moderate to advanced canal and bilateral subarticular stenosis. Electronically Signed   By: Jeannine Boga M.D.   On: 03/10/2018 04:23  ____________________________________________   PROCEDURES  Critical Care performed: No   Procedure(s) performed:   Procedures   ____________________________________________   INITIAL IMPRESSION / ASSESSMENT AND PLAN / ED COURSE  As part of my medical decision making, I reviewed the following data within the [place] notes reviewed and incorporated, Labs reviewed , Old chart reviewed, Notes from prior ED visits and [place] Controlled Substance Database    Differential diagnosis includes, but is not limited to, musculoskeletal pain/strain, ovarian cyst pain, renal colic,  UTI/pyelonephritis, diverticulitis.  The patient's symptoms and signs are not consistent with ovarian torsion or PID.  She is not having any other vaginal complaints and is currently on her period.  Her lab work is all within normal limits except for a mild leukocytosis of 13.7.  She has some blood in her urine but she is finishing her menstrual cycle.  Urine pregnancy test is negative.  Vital signs are within normal limits although her blood pressure is a little bit lower than anticipated.  The report of left lower quadrant pain that radiates into her left leg is somewhat puzzling.  I would expect her symptoms to be the result of sciatica but she is not really having low back pain except in the left flank.  I will obtain a CT renal stone protocol abdomen and pelvis to evaluate for ureteral colic and we should be able to get a good visualization of whether or not there is any edema or evidence of diverticulitis.  The patient is in agreement with the plan.  After we obtain the results, I can tailor my symptomatic treatment, but I doubt an emergent cause of the symptoms at this time.  She has no focal neurological deficits concerning for cauda equina syndrome.  I have also ordered a lumbar spine recon along with the CT abdomen pelvis for better visualization of possible radicular symptoms  Clinical Course as of Mar 10 442  Wed Mar 10, 2018  0435 No indication of acute intra-abdominal abnormality on CT scan.  She does have a 3.3 cm cyst on her right ovary but that is not the location of her pain and does not correlate clinically.  The L-spine reconstructions demonstrate significant disc bulge and narrowing of the canal and I think that most of what she is experiencing is sciatica and radicular symptoms.  I discussed all this with her.  I will give her medications as prescribed below to try to help and I encouraged her to follow-up with either Dr. Sharlet Salina or the [facility] pain clinic given her young age and relatively  advanced and severe back problems.  I gave my usual and customary return precautions.  She understands and agrees with the plan.  I could not provide any narcotics because she drove herself and her son to the emergency department tonight.   [CF]  [id number] I checked the controlled substance database, and she has had no prescriptions in the last 11 months, and before that she had only 2 small prescriptions for narcotics and one prescription almost to 4 years ago for diazepam.  She has low risk for abuse and I will give a short course of Norco as well as Flexeril and prednisone taper.   [CF]  0441 I am giving a one-time dose of ibuprofen 600 mg along with Tylenol 1000 mg and prednisone 60 mg.  I am not recommending that she continue on the ibuprofen, however, because I am giving her prednisone prescription and I do not want to  give her any increased probability of stomach ulcers.   [CF]    Clinical Course User Index [CF] Hinda Kehr, MD    ____________________________________________  FINAL CLINICAL IMPRESSION(S) / ED DIAGNOSES  Final diagnoses:  Sciatica  LLQ pain     MEDICATIONS GIVEN DURING THIS VISIT:  Medications  acetaminophen (TYLENOL) tablet 1,000 mg (has no administration in time range)  ibuprofen (ADVIL,MOTRIN) tablet 600 mg (600 mg Oral Given 03/10/18 0442)  predniSONE (DELTASONE) tablet 60 mg (60 mg Oral Given 03/10/18 0442)     ED Discharge Orders        Ordered    HYDROcodone-acetaminophen (NORCO/VICODIN) 5-325 MG tablet  Every 4 hours PRN     03/10/18 0442    predniSONE (DELTASONE) 10 MG tablet     03/10/18 0442    cyclobenzaprine (FLEXERIL) 5 MG tablet  3 times daily PRN     03/10/18 0442       Note:  This document was prepared using Dragon voice recognition software and may include unintentional dictation errors.    Hinda Kehr, MD 03/10/18 [id number]

## 2018-03-10 NOTE — ED Notes (Signed)
Pt sleeping at this time, audible snoring can be heard, pt's son at bedside.

## 2018-03-10 NOTE — ED Notes (Signed)
Patient transported to CT 

## 2018-03-10 NOTE — ED Notes (Signed)

## 2018-03-10 NOTE — Discharge Instructions (Signed)
Your workup in the Emergency Department today was reassuring.  We think your symptoms are due to chronic issue with your back causing radicular pain and sciatica (please read through the included information).  We recommend you drink plenty of fluids, take your regular medications and/or any new ones prescribed today, and follow up with the doctor(s) listed in these documents as recommended.    Take Norco as prescribed for severe pain. Do not drink alcohol, drive or participate in any other potentially dangerous activities while taking this medication as it may make you sleepy. Do not take this medication with any other sedating medications, either prescription or over-the-counter. If you were prescribed Percocet or Vicodin, do not take these with acetaminophen (Tylenol) as it is already contained within these medications.   This medication is an opiate (or narcotic) pain medication and can be habit forming.  Use it as little as possible to achieve adequate pain control.  Do not use or use it with extreme caution if you have a history of opiate abuse or dependence.  If you are on a pain contract with your primary care doctor or a pain specialist, be sure to let them know you were prescribed this medication today from the [facility] Emergency Department.  This medication is intended for your use only - do not give any to anyone else and keep it in a secure place where nobody else, especially children, have access to it.  It will also cause or worsen constipation, so you may want to consider taking an over-the-counter stool softener while you are taking this medication.  Return to the Emergency Department if you develop new or worsening symptoms that concern you.

## 2018-07-27 ENCOUNTER — Encounter
Admit: 2018-07-27 | Discharge: 2018-07-27 | Disposition: A | Discharge status: Home / Self Care | Payer: PRIVATE HEALTH INSURANCE | Attending: Emergency Medicine

## 2018-07-27 MED ORDER — DICLOFENAC 1 % TOPICAL GEL
Freq: Four times a day (QID) | TOPICAL | 0 refills | 0 days | Status: CP
Start: 2018-07-27 — End: 2019-07-27

## 2018-07-27 MED ORDER — IBUPROFEN 800 MG TABLET: 800 mg | tablet | Freq: Three times a day (TID) | 0 refills | 0 days | Status: AC

## 2018-07-27 MED ORDER — IBUPROFEN 800 MG TABLET
ORAL_TABLET | Freq: Three times a day (TID) | ORAL | 0 refills | 0.00000 days | Status: CP | PRN
Start: 2018-07-27 — End: 2018-07-27

## 2018-09-05 ENCOUNTER — Other Ambulatory Visit: Payer: Self-pay

## 2018-09-05 ENCOUNTER — Encounter: Payer: Self-pay | Admitting: Emergency Medicine

## 2018-09-05 ENCOUNTER — Emergency Department
Admission: EM | Admit: 2018-09-05 | Discharge: 2018-09-05 | Disposition: A | Discharge status: Home / Self Care | Payer: Medicaid Other | Attending: Student in an Organized Health Care Education/Training Program | Admitting: Student in an Organized Health Care Education/Training Program

## 2018-09-05 ENCOUNTER — Emergency Department: Payer: Medicaid Other

## 2018-09-05 DIAGNOSIS — J45909 Unspecified asthma, uncomplicated: Secondary | ICD-10-CM | POA: Insufficient documentation

## 2018-09-05 DIAGNOSIS — Z79899 Other long term (current) drug therapy: Secondary | ICD-10-CM | POA: Diagnosis not present

## 2018-09-05 DIAGNOSIS — R0602 Shortness of breath: Secondary | ICD-10-CM | POA: Diagnosis present

## 2018-09-05 DIAGNOSIS — J40 Bronchitis, not specified as acute or chronic: Secondary | ICD-10-CM

## 2018-09-05 DIAGNOSIS — F17210 Nicotine dependence, cigarettes, uncomplicated: Secondary | ICD-10-CM | POA: Insufficient documentation

## 2018-09-05 LAB — CBC WITH DIFFERENTIAL/PLATELET
Abs Immature Granulocytes: 0.18 10*3/uL — ABNORMAL HIGH (ref 0.00–0.07)
BASOS ABS: 0.1 10*3/uL (ref 0.0–0.1)
Basophils Relative: 1 %
EOS PCT: 1 %
Eosinophils Absolute: 0.2 10*3/uL (ref 0.0–0.5)
HEMATOCRIT: 41.1 % (ref 36.0–46.0)
Hemoglobin: 13.1 g/dL (ref 12.0–15.0)
Immature Granulocytes: 1 %
LYMPHS ABS: 5.2 10*3/uL — AB (ref 0.7–4.0)
Lymphocytes Relative: 33 %
MCH: 28.5 pg (ref 26.0–34.0)
MCHC: 31.9 g/dL (ref 30.0–36.0)
MCV: 89.3 fL (ref 80.0–100.0)
MONO ABS: 1 10*3/uL (ref 0.1–1.0)
Monocytes Relative: 6 %
NEUTROS ABS: 9.2 10*3/uL — AB (ref 1.7–7.7)
Neutrophils Relative %: 58 %
PLATELETS: 310 10*3/uL (ref 150–400)
RBC: 4.6 MIL/uL (ref 3.87–5.11)
RDW: 14.2 % (ref 11.5–15.5)
SMEAR REVIEW: NORMAL
WBC: 15.8 10*3/uL — AB (ref 4.0–10.5)
nRBC: 0 % (ref 0.0–0.2)

## 2018-09-05 LAB — COMPREHENSIVE METABOLIC PANEL
ALK PHOS: 79 U/L (ref 38–126)
ALT: 27 U/L (ref 0–44)
AST: 27 U/L (ref 15–41)
Albumin: 3.5 g/dL (ref 3.5–5.0)
Anion gap: 6 (ref 5–15)
BILIRUBIN TOTAL: 0.4 mg/dL (ref 0.3–1.2)
BUN: 12 mg/dL (ref 6–20)
CALCIUM: 8.9 mg/dL (ref 8.9–10.3)
CO2: 28 mmol/L (ref 22–32)
CREATININE: 0.56 mg/dL (ref 0.44–1.00)
Chloride: 105 mmol/L (ref 98–111)
Glucose, Bld: 142 mg/dL — ABNORMAL HIGH (ref 70–99)
Potassium: 3.5 mmol/L (ref 3.5–5.1)
Sodium: 139 mmol/L (ref 135–145)
TOTAL PROTEIN: 7.1 g/dL (ref 6.5–8.1)

## 2018-09-05 LAB — BRAIN NATRIURETIC PEPTIDE: B NATRIURETIC PEPTIDE 5: 21 pg/mL (ref 0.0–100.0)

## 2018-09-05 LAB — TROPONIN I

## 2018-09-05 MED ORDER — IPRATROPIUM-ALBUTEROL 0.5-2.5 (3) MG/3ML IN SOLN: 3.0000 mL | Freq: Four times a day (QID) | RESPIRATORY_TRACT | 1 refills | Status: DC | PRN

## 2018-09-05 MED ORDER — HYDROCOD POLST-CPM POLST ER 10-8 MG/5ML PO SUER: 5.0000 mL | Freq: Every evening | ORAL | 0 refills | Status: DC | PRN

## 2018-09-05 MED ORDER — PREDNISONE 10 MG PO TABS: 10.0000 mg | ORAL_TABLET | Freq: Every day | ORAL | 0 refills | Status: DC

## 2018-09-05 MED ORDER — HYDROCOD POLST-CPM POLST ER 10-8 MG/5ML PO SUER
5.0000 mL | Freq: Once | ORAL | Status: AC
  Administered 2018-09-05: 5 mL via ORAL
  Filled 2018-09-05: qty 5

## 2018-09-05 MED ORDER — ACETAMINOPHEN 500 MG PO TABS
1000.0000 mg | ORAL_TABLET | Freq: Once | ORAL | Status: AC
  Administered 2018-09-05: 1000 mg via ORAL
  Filled 2018-09-05: qty 2

## 2018-09-05 MED ORDER — IPRATROPIUM-ALBUTEROL 0.5-2.5 (3) MG/3ML IN SOLN
3.0000 mL | Freq: Once | RESPIRATORY_TRACT | Status: AC
  Administered 2018-09-05: 3 mL via RESPIRATORY_TRACT
  Filled 2018-09-05: qty 3

## 2018-09-05 NOTE — ED Provider Notes (Signed)
[facility] Emergency Department Provider Note    First MD Initiated Contact with Patient 09/05/18 [phone]     (approximate)  I have reviewed the triage vital signs and the nursing notes.   HISTORY  Chief Complaint Shortness of Breath    HPI Teresa Malone is a 38 y.o. female below listed past medical history presents with several days of progressively worsening chest pain shortness of breath and dry cough.  States it is worse with exertion.  Denies any diaphoresis or nausea.  No measured fevers.  Was recently started on antibiotics for sinus infection and given prednisone but does not feel that she is had any improvement.  Has not noticed any worsening leg swelling.    Past Medical History:  Diagnosis Date  . Asthma   . Atypical chest pain   . Bronchitis   . GERD (gastroesophageal reflux disease)   . Hypoglycemia   . Morbid obesity ([place])    Family History  Problem Relation Age of Onset  . COPD Mother    Past Surgical History:  Procedure Laterality Date  . CHOLECYSTECTOMY    . COLONOSCOPY WITH PROPOFOL N/A 10/27/2017   Procedure: COLONOSCOPY WITH PROPOFOL;  Surgeon: Toledo, Benay Pike, MD;  Location: [facility] ENDOSCOPY;  Service: Gastroenterology;  Laterality: N/A;  . ESOPHAGOGASTRODUODENOSCOPY (EGD) WITH PROPOFOL N/A 10/27/2017   Procedure: ESOPHAGOGASTRODUODENOSCOPY (EGD) WITH PROPOFOL;  Surgeon: Toledo, Benay Pike, MD;  Location: [facility] ENDOSCOPY;  Service: Gastroenterology;  Laterality: N/A;   There are no active problems to display for this patient.     Prior to Admission medications   Medication Sig Start Date End Date Taking? Authorizing Provider  albuterol (PROVENTIL HFA) 108 (90 BASE) MCG/ACT inhaler Inhale 2 puffs into the lungs every 4 (four) hours as needed for wheezing or shortness of breath. 01/10/15   Carrie Mew, MD  albuterol (PROVENTIL HFA;VENTOLIN HFA) 108 (90 Base) MCG/ACT inhaler Inhale 2 puffs into the lungs every 6 (six) hours  as needed for wheezing or shortness of breath. 08/12/16   Frederich Cha, MD  ALBUTEROL SULFATE HFA IN Inhale 1 puff into the lungs 2 (two) times daily as needed (shortness of breath or wheezing).    [provider]  azithromycin (ZITHROMAX) 500 MG tablet 1 tablet daily for the next 5 days 08/12/16   Frederich Cha, MD  benzonatate (TESSALON) 200 MG capsule Take 1 capsule (200 mg total) by mouth 3 (three) times daily as needed. 08/12/16   Frederich Cha, MD  chlorpheniramine-HYDROcodone [facility] PENNKINETIC ER) 10-8 MG/5ML SUER Take 5 mLs by mouth at bedtime as needed for cough. 09/05/18   Merlyn Lot, MD  cyclobenzaprine (FLEXERIL) 5 MG tablet Take 1 tablet (5 mg total) by mouth 3 (three) times daily as needed for muscle spasms. 03/10/18   Hinda Kehr, MD  HYDROcodone-acetaminophen (NORCO/VICODIN) 5-325 MG tablet Take 1-2 tablets by mouth every 4 (four) hours as needed for moderate pain. 03/10/18   Hinda Kehr, MD  ipratropium-albuterol (DUONEB) 0.5-2.5 (3) MG/3ML SOLN Take 3 mLs by nebulization every 6 (six) hours as needed. 09/05/18   Merlyn Lot, MD  omeprazole (PRILOSEC) 40 MG capsule Take 40 mg by mouth daily.    [provider]  polyethylene glycol powder (GLYCOLAX/MIRALAX) powder 1 cap full in a full glass of water, two times a day for 3 days. 05/08/17   Carrie Mew, MD  predniSONE (DELTASONE) 10 MG tablet Take 6 tabs (60 mg) PO x 3 days, then take 4 tabs (40 mg) PO x  3 days, then take 2 tabs (20 mg) PO x 3 days, then take 1 tab (10 mg) PO x 3 days, then take 1/2 tab (5 mg) PO x 4 days. 03/10/18   Hinda Kehr, MD  senna-docusate (SENOKOT-S) 8.6-50 MG tablet Take 2 tablets by mouth 2 (two) times daily. 05/08/17   Carrie Mew, MD    Allergies Flagyl [metronidazole]; Aspirin; Ky plus spermicidal [nonoxynol 9]; Meloxicam; Morphine and related; and Tramadol    Social History Social History   Tobacco Use  . Smoking status: Current Every Day Smoker     Packs/day: 0.50    Types: Cigarettes  . Smokeless tobacco: Never Used  Substance Use Topics  . Alcohol use: No  . Drug use: No    Review of Systems Patient denies headaches, rhinorrhea, blurry vision, numbness, shortness of breath, chest pain, edema, cough, abdominal pain, nausea, vomiting, diarrhea, dysuria, fevers, rashes or hallucinations unless otherwise stated above in HPI. ____________________________________________   PHYSICAL EXAM:  VITAL SIGNS: Vitals:   09/05/18 0817  BP: 140/68  Pulse: 78  Resp: 11  Temp: 98.1 F (36.7 C)  SpO2: 99%    Constitutional: Alert and oriented.  Eyes: Conjunctivae are normal.  Head: Atraumatic. Nose: No congestion/rhinnorhea. Mouth/Throat: Mucous membranes are moist.   Neck: No stridor. Painless ROM.  Cardiovascular: Normal rate, regular rhythm. Grossly normal heart sounds.  Good peripheral circulation. Respiratory: Normal respiratory effort.  No retractions. Lungs with coarse end expiratory breathsounds Gastrointestinal: Soft and nontender. No distention. No abdominal bruits. No CVA tenderness. Genitourinary:  Musculoskeletal: No lower extremity tenderness nor edema.  No joint effusions. Neurologic:  Normal speech and language. No gross focal neurologic deficits are appreciated. No facial droop Skin:  Skin is warm, dry and intact. No rash noted. Psychiatric: Mood and affect are normal. Speech and behavior are normal.  ____________________________________________   LABS (all labs ordered are listed, but only abnormal results are displayed)  Results for orders placed or performed during the hospital encounter of 09/05/18 (from the past 24 hour(s))  CBC with Differential/Platelet     Status: Abnormal   Collection Time: 09/05/18  8:29 AM  Result Value Ref Range   WBC 15.8 (H) 4.0 - 10.5 K/uL   RBC 4.60 3.87 - 5.11 MIL/uL   Hemoglobin 13.1 12.0 - 15.0 g/dL   HCT 41.1 36.0 - 46.0 %   MCV 89.3 80.0 - 100.0 fL   MCH 28.5 26.0 -  34.0 pg   MCHC 31.9 30.0 - 36.0 g/dL   RDW 14.2 11.5 - 15.5 %   Platelets 310 150 - 400 K/uL   nRBC 0.0 0.0 - 0.2 %   Neutrophils Relative % 58 %   Neutro Abs 9.2 (H) 1.7 - 7.7 K/uL   Lymphocytes Relative 33 %   Lymphs Abs 5.2 (H) 0.7 - 4.0 K/uL   Monocytes Relative 6 %   Monocytes Absolute 1.0 0.1 - 1.0 K/uL   Eosinophils Relative 1 %   Eosinophils Absolute 0.2 0.0 - 0.5 K/uL   Basophils Relative 1 %   Basophils Absolute 0.1 0.0 - 0.1 K/uL   RBC Morphology MORPHOLOGY UNREMARKABLE    Smear Review Normal platelet morphology    Immature Granulocytes 1 %   Abs Immature Granulocytes 0.18 (H) 0.00 - 0.07 K/uL   Reactive, Benign Lymphocytes PRESENT   Comprehensive metabolic panel     Status: Abnormal   Collection Time: 09/05/18  8:29 AM  Result Value Ref Range   Sodium 139 135 - 145  mmol/L   Potassium 3.5 3.5 - 5.1 mmol/L   Chloride 105 98 - 111 mmol/L   CO2 28 22 - 32 mmol/L   Glucose, Bld 142 (H) 70 - 99 mg/dL   BUN 12 6 - 20 mg/dL   Creatinine, Ser 0.56 0.44 - 1.00 mg/dL   Calcium 8.9 8.9 - 10.3 mg/dL   Total Protein 7.1 6.5 - 8.1 g/dL   Albumin 3.5 3.5 - 5.0 g/dL   AST 27 15 - 41 U/L   ALT 27 0 - 44 U/L   Alkaline Phosphatase 79 38 - 126 U/L   Total Bilirubin 0.4 0.3 - 1.2 mg/dL   GFR calc non Af Amer >60 >60 mL/min   GFR calc Af Amer >60 >60 mL/min   Anion gap 6 5 - 15  Troponin I - ONCE - STAT     Status: None   Collection Time: 09/05/18  8:29 AM  Result Value Ref Range   Troponin I <0.03 <0.03 ng/mL  Brain natriuretic peptide     Status: None   Collection Time: 09/05/18  9:21 AM  Result Value Ref Range   B Natriuretic Peptide 21.0 0.0 - 100.0 pg/mL   ____________________________________________  EKG My review and personal interpretation at Time: 8:10   Indication: sob  Rate: 75  Rhythm: sinus Axis: normal Other: normal intervals, no stemi ____________________________________________  RADIOLOGY  I personally reviewed all radiographic images ordered to  evaluate for the above acute complaints and reviewed radiology reports and findings.  These findings were personally discussed with the patient.  Please see medical record for radiology report.  ____________________________________________   PROCEDURES  Procedure(s) performed:  Procedures    Critical Care performed: no ____________________________________________   INITIAL IMPRESSION / ASSESSMENT AND PLAN / ED COURSE  Pertinent labs & imaging results that were available during my care of the patient were reviewed by me and considered in my medical decision making (see chart for details).   DDX: Asthma, copd, CHF, pna, ptx, malignancy, Pe, anemia   Teresa Malone is a 38 y.o. who presents to the ED with symptoms as described above.  Patient well-appearing in no acute distress.  Symptoms seem primarily bronchitic in nature based on exam.  Blood work and x-ray will be ordered to evaluate for the above differential.   Clinical Course as of Sep 05 1008  Sun Sep 05, 2018  1006 Patient reassessed with improvement in her symptoms after nebulizer treatment.  Will extend prednisone to a taper she still having bronchitic symptoms.  This not consistent with ACS heart failure, PE dissection AAA or pneumonia.  Have discussed with the patient and available family all diagnostics and treatments performed thus far and all questions were answered to the best of my ability. The patient demonstrates understanding and agreement with plan.    [PR]    Clinical Course User Index [PR] Merlyn Lot, MD     As part of my medical decision making, I reviewed the following data within the [place] notes reviewed and incorporated, Labs reviewed, notes from prior ED visits and [place] Controlled Substance Database   ____________________________________________   FINAL CLINICAL IMPRESSION(S) / ED DIAGNOSES  Final diagnoses:  Bronchitis      NEW MEDICATIONS STARTED DURING  THIS VISIT:  New Prescriptions   CHLORPHENIRAMINE-HYDROCODONE (TUSSIONEX PENNKINETIC ER) 10-8 MG/5ML SUER    Take 5 mLs by mouth at bedtime as needed for cough.   IPRATROPIUM-ALBUTEROL (DUONEB) 0.5-2.5 (3) MG/3ML SOLN  Take 3 mLs by nebulization every 6 (six) hours as needed.     Note:  This document was prepared using Dragon voice recognition software and may include unintentional dictation errors.    Merlyn Lot, MD 09/05/18 1010

## 2018-09-05 NOTE — ED Triage Notes (Signed)
Here for [facility] and chest pain. Reports started abx Thursday for sinus infection but now feels like it is hard to breathe and having pain in chest. Has had cough.  Breathing worse when up moving around and laying flat.

## 2018-09-05 NOTE — ED Notes (Signed)
Pt verbalized understanding of discharge instructions. NAD at this time. 

## 2018-09-05 NOTE — ED Notes (Signed)
Patient transported to X-ray 

## 2018-09-19 DIAGNOSIS — J101 Influenza due to other identified influenza virus with other respiratory manifestations: Principal | ICD-10-CM

## 2018-09-20 ENCOUNTER — Encounter
Admit: 2018-09-20 | Discharge: 2018-09-20 | Disposition: A | Discharge status: Home / Self Care | Payer: PRIVATE HEALTH INSURANCE

## 2018-09-20 MED ORDER — OSELTAMIVIR 75 MG CAPSULE
ORAL_CAPSULE | Freq: Two times a day (BID) | ORAL | 0 refills | 0 days | Status: CP
Start: 2018-09-20 — End: 2018-09-25

## 2019-02-13 ENCOUNTER — Encounter
Admit: 2019-02-13 | Discharge: 2019-02-13 | Disposition: A | Discharge status: Home / Self Care | Payer: PRIVATE HEALTH INSURANCE | Attending: Emergency Medicine

## 2019-02-13 ENCOUNTER — Ambulatory Visit
Admit: 2019-02-13 | Discharge: 2019-02-13 | Disposition: A | Discharge status: Home / Self Care | Payer: PRIVATE HEALTH INSURANCE | Attending: Emergency Medicine

## 2019-02-13 DIAGNOSIS — R0602 Shortness of breath: Principal | ICD-10-CM

## 2019-06-08 ENCOUNTER — Other Ambulatory Visit: Payer: Self-pay | Admitting: Family Medicine

## 2019-06-08 DIAGNOSIS — R1030 Lower abdominal pain, unspecified: Secondary | ICD-10-CM

## 2019-06-17 ENCOUNTER — Ambulatory Visit
Admission: RE | Admit: 2019-06-17 | Discharge: 2019-06-17 | Disposition: A | Discharge status: Home / Self Care | Payer: Medicaid Other | Source: Ambulatory Visit | Attending: Family Medicine | Admitting: Family Medicine

## 2019-06-17 ENCOUNTER — Other Ambulatory Visit: Payer: Self-pay

## 2019-06-17 DIAGNOSIS — R1030 Lower abdominal pain, unspecified: Secondary | ICD-10-CM | POA: Diagnosis not present

## 2019-07-13 DIAGNOSIS — N83209 Unspecified ovarian cyst, unspecified side: Principal | ICD-10-CM

## 2019-07-14 ENCOUNTER — Ambulatory Visit
Admit: 2019-07-14 | Discharge: 2019-07-14 | Disposition: A | Discharge status: Home / Self Care | Payer: PRIVATE HEALTH INSURANCE | Attending: Family

## 2019-07-14 DIAGNOSIS — H9201 Otalgia, right ear: Principal | ICD-10-CM

## 2019-07-27 ENCOUNTER — Ambulatory Visit: Admit: 2019-07-27 | Discharge: 2019-07-28 | Payer: PRIVATE HEALTH INSURANCE

## 2019-07-27 ENCOUNTER — Ambulatory Visit
Admit: 2019-07-27 | Discharge: 2019-07-28 | Payer: PRIVATE HEALTH INSURANCE | Attending: Obstetrics & Gynecology | Primary: Obstetrics & Gynecology

## 2019-07-27 DIAGNOSIS — N3946 Mixed incontinence: Principal | ICD-10-CM

## 2019-07-27 DIAGNOSIS — N83209 Unspecified ovarian cyst, unspecified side: Principal | ICD-10-CM

## 2019-07-27 DIAGNOSIS — N83201 Unspecified ovarian cyst, right side: Principal | ICD-10-CM

## 2019-07-27 DIAGNOSIS — Z6841 Body Mass Index (BMI) 40.0 and over, adult: Secondary | ICD-10-CM

## 2019-07-27 DIAGNOSIS — R102 Pelvic and perineal pain: Principal | ICD-10-CM

## 2019-07-27 DIAGNOSIS — E119 Type 2 diabetes mellitus without complications: Principal | ICD-10-CM

## 2019-07-27 DIAGNOSIS — Z23 Encounter for immunization: Principal | ICD-10-CM

## 2019-07-27 MED ORDER — BLOOD SUGAR DIAGNOSTIC STRIPS
ORAL_STRIP | 12 refills | 0 days | Status: CP
Start: 2019-07-27 — End: ?

## 2019-08-15 ENCOUNTER — Encounter: Admit: 2019-08-15 | Discharge: 2019-08-16 | Payer: PRIVATE HEALTH INSURANCE

## 2019-08-18 ENCOUNTER — Encounter
Admit: 2019-08-18 | Discharge: 2019-08-19 | Payer: PRIVATE HEALTH INSURANCE | Attending: Women's Health | Primary: Women's Health

## 2019-08-18 DIAGNOSIS — N3946 Mixed incontinence: Principal | ICD-10-CM

## 2019-08-18 DIAGNOSIS — M62838 Other muscle spasm: Principal | ICD-10-CM

## 2019-08-18 MED ORDER — SOLIFENACIN 10 MG TABLET
ORAL_TABLET | Freq: Every day | ORAL | 11 refills | 30.00000 days | Status: CP
Start: 2019-08-18 — End: ?

## 2019-09-02 ENCOUNTER — Non-Acute Institutional Stay: Admit: 2019-09-02 | Discharge: 2019-09-02 | Payer: PRIVATE HEALTH INSURANCE

## 2019-09-02 ENCOUNTER — Encounter
Admit: 2019-09-02 | Discharge: 2019-09-02 | Payer: PRIVATE HEALTH INSURANCE | Attending: Podiatrist | Primary: Podiatrist

## 2019-09-02 ENCOUNTER — Encounter: Admit: 2019-09-02 | Discharge: 2019-09-02 | Payer: PRIVATE HEALTH INSURANCE

## 2019-09-02 ENCOUNTER — Non-Acute Institutional Stay
Admit: 2019-09-02 | Discharge: 2019-09-02 | Payer: PRIVATE HEALTH INSURANCE | Attending: Women's Health | Primary: Women's Health

## 2019-09-02 DIAGNOSIS — L600 Ingrowing nail: Principal | ICD-10-CM

## 2019-09-02 DIAGNOSIS — Z01818 Encounter for other preprocedural examination: Principal | ICD-10-CM

## 2019-09-02 DIAGNOSIS — K219 Gastro-esophageal reflux disease without esophagitis: Principal | ICD-10-CM

## 2019-09-02 DIAGNOSIS — E119 Type 2 diabetes mellitus without complications: Principal | ICD-10-CM

## 2019-09-02 DIAGNOSIS — Z6841 Body Mass Index (BMI) 40.0 and over, adult: Secondary | ICD-10-CM

## 2019-09-02 DIAGNOSIS — M79671 Pain in right foot: Secondary | ICD-10-CM

## 2019-09-02 DIAGNOSIS — M79672 Pain in left foot: Secondary | ICD-10-CM

## 2019-09-02 DIAGNOSIS — E1142 Type 2 diabetes mellitus with diabetic polyneuropathy: Principal | ICD-10-CM

## 2019-09-02 DIAGNOSIS — F411 Generalized anxiety disorder: Principal | ICD-10-CM

## 2019-09-02 DIAGNOSIS — J45909 Unspecified asthma, uncomplicated: Principal | ICD-10-CM

## 2019-09-19 ENCOUNTER — Ambulatory Visit: Admit: 2019-09-19 | Discharge: 2019-09-20 | Payer: PRIVATE HEALTH INSURANCE | Attending: Family | Primary: Family

## 2019-09-21 ENCOUNTER — Encounter
Admit: 2019-09-21 | Discharge: 2019-09-21 | Payer: PRIVATE HEALTH INSURANCE | Attending: Registered Nurse | Primary: Registered Nurse

## 2019-09-21 ENCOUNTER — Ambulatory Visit: Admit: 2019-09-21 | Discharge: 2019-09-21 | Payer: PRIVATE HEALTH INSURANCE

## 2019-09-21 MED ORDER — MIRABEGRON ER 25 MG TABLET,EXTENDED RELEASE 24 HR
ORAL_TABLET | Freq: Every day | ORAL | 11 refills | 30 days | Status: CP
Start: 2019-09-21 — End: ?

## 2019-09-22 DIAGNOSIS — L600 Ingrowing nail: Principal | ICD-10-CM

## 2019-09-22 MED ORDER — HYDROCODONE 5 MG-ACETAMINOPHEN 325 MG TABLET
ORAL_TABLET | Freq: Four times a day (QID) | ORAL | 0 refills | 4.00000 days | Status: CP | PRN
Start: 2019-09-22 — End: ?

## 2019-09-22 MED ORDER — CEPHALEXIN 500 MG CAPSULE
ORAL_CAPSULE | Freq: Three times a day (TID) | ORAL | 0 refills | 7 days | Status: CP
Start: 2019-09-22 — End: 2019-09-29

## 2019-09-22 MED ORDER — GABAPENTIN 300 MG CAPSULE
ORAL_CAPSULE | Freq: Three times a day (TID) | ORAL | 0 refills | 10.00000 days | Status: CP
Start: 2019-09-22 — End: 2019-10-02

## 2019-09-22 MED ORDER — HYDROCODONE 5 MG-ACETAMINOPHEN 325 MG TABLET: 1 | tablet | Freq: Four times a day (QID) | 0 refills | 4 days | Status: AC

## 2019-09-27 ENCOUNTER — Encounter: Admit: 2019-09-27 | Discharge: 2019-09-28 | Payer: MEDICAID | Attending: Podiatrist | Primary: Podiatrist

## 2019-09-27 DIAGNOSIS — R2242 Localized swelling, mass and lump, left lower limb: Principal | ICD-10-CM

## 2019-10-20 ENCOUNTER — Encounter
Admit: 2019-10-20 | Discharge: 2019-10-21 | Payer: PRIVATE HEALTH INSURANCE | Attending: Podiatrist | Primary: Podiatrist

## 2019-10-20 ENCOUNTER — Encounter: Admit: 2019-10-20 | Discharge: 2019-10-21 | Payer: PRIVATE HEALTH INSURANCE

## 2019-10-20 DIAGNOSIS — Z9889 Other specified postprocedural states: Principal | ICD-10-CM

## 2019-10-20 DIAGNOSIS — R2242 Localized swelling, mass and lump, left lower limb: Principal | ICD-10-CM

## 2019-10-20 DIAGNOSIS — E1140 Type 2 diabetes mellitus with diabetic neuropathy, unspecified: Principal | ICD-10-CM

## 2019-11-22 ENCOUNTER — Ambulatory Visit (INDEPENDENT_AMBULATORY_CARE_PROVIDER_SITE_OTHER)
Admission: RE | Admit: 2019-11-22 | Discharge: 2019-11-22 | Disposition: A | Discharge status: Home / Self Care | Payer: Medicaid Other | Source: Ambulatory Visit

## 2019-11-22 ENCOUNTER — Telehealth: Payer: Medicaid Other | Admitting: Nurse Practitioner

## 2019-11-22 DIAGNOSIS — H60333 Swimmer's ear, bilateral: Secondary | ICD-10-CM

## 2019-11-22 DIAGNOSIS — H9201 Otalgia, right ear: Secondary | ICD-10-CM | POA: Diagnosis not present

## 2019-11-22 MED ORDER — OFLOXACIN 0.3 % OT SOLN: 5.0000 [drp] | Freq: Two times a day (BID) | OTIC | 0 refills | Status: DC

## 2019-11-22 NOTE — ED Provider Notes (Signed)
Virtual Visit via Video Note:  LANDEN GOETZMAN  initiated request for Telemedicine visit with [facility] Urgent Care team. I connected with Teresa Malone  on 11/22/2019 at 6:30 PM  for a synchronized telemedicine visit using a video enabled HIPPA compliant telemedicine application. I verified that I am speaking with Teresa Malone  using two identifiers. Kattleya Kuhnert C Janai Maudlin, PA-C  was physically located in a [facility] Urgent care site and Teresa Malone was located at a different location.   The limitations of evaluation and management by telemedicine as well as the availability of in-person appointments were discussed. Patient was informed that she  may incur a bill ( including co-pay) for this virtual visit encounter. Teresa Malone  expressed understanding and gave verbal consent to proceed with virtual visit.     History of Present Illness:Teresa Malone  is a 39 y.o. female presents for evaluation of ear pain.  Patient notes that for the past 2 weeks she has had intermittent discomfort in her right ear.  Recently symptoms have worsened and she has felt some tenderness to touching her ear externally.  She also notes that she has a muffled and popping sensations.  She has a history of allergies, but has not been receiving her allergy shots of recently.  She has been on cetirizine and Flonase consistently.  She denies any fevers.  Denies drainage.    Allergies  Allergen Reactions  . Flagyl [Metronidazole] Shortness Of Breath  . Aspirin Nausea And Vomiting  . Ky Plus Spermicidal [Nonoxynol 9] Other (See Comments)    Yeast Infection   . Meloxicam Hives  . Morphine And Related Hives  . Tramadol Hives     Past Medical History:  Diagnosis Date  . Asthma   . Atypical chest pain   . Bronchitis   . GERD (gastroesophageal reflux disease)   . Hypoglycemia   . Morbid obesity ([place])      Social History   Tobacco Use  . Smoking status: Current Every Day Smoker    Packs/day: 0.50     Types: Cigarettes  . Smokeless tobacco: Never Used  Substance Use Topics  . Alcohol use: No  . Drug use: No        Observations/Objective: Physical Exam  Constitutional: She is oriented to person, place, and time and well-developed, well-nourished, and in no distress. No distress.  HENT:  Head: Normocephalic and atraumatic.  No obvious swelling or erythema noted to external ear, does report tenderness to palpation of right tragus  Pulmonary/Chest: Effort normal. No respiratory distress.  Speaking in full sentences  Musculoskeletal:     Cervical back: Normal range of motion.  Neurological: She is alert and oriented to person, place, and time.  Speech clear, face symmetric     Assessment and Plan:  Right ear pain  Patient with right ear pain, possible eustachian tube dysfunction versus otitis media/externa.  Discussed with patient unable to visualize TM, will treating for otitis externa appropriate with drops and continuing allergy medicine for eustachian tube dysfunction.  Recommended to follow-up in person for better evaluation of ear if pain persisting or worsening.  After completion of visit it was noted that ofloxacin drops have been sent by another provider through ED visit.  Advised patient to use these drops.  Discussed strict return precautions. Patient verbalized understanding and is agreeable with plan.     Follow Up Instructions:     I discussed the assessment and treatment plan with the  patient. The patient was provided an opportunity to ask questions and all were answered. The patient agreed with the plan and demonstrated an understanding of the instructions.   The patient was advised to call back or seek an in-person evaluation if the symptoms worsen or if the condition fails to improve as anticipated.      Janith Lima, PA-C  11/22/2019 6:30 PM         Janith Lima, PA-C 11/23/19 0848

## 2019-11-22 NOTE — Progress Notes (Signed)
E Visit for Swimmer's Ear  We are sorry that you are not feeling well. Here is how we plan to help!  Based on what you have shared with me it looks like you have swimmers ear. Swimmer's ear is a redness or swelling, irritation, or infection of your outer ear canal.  These symptoms usually occur within a few days of swimming.  Your ear canal is a tube that goes from the opening of the ear to the eardrum.  When water stays in your ear canal, germs can grow.  This is a painful condition that often happens to children and swimmers of all ages.  It is not contagious and oral antibiotics are not required to treat uncomplicated swimmer's ear.  The usual symptoms include: Itching inside the ear, Redness or a sense of swelling in the ear, Pain when the ear is tugged on when pressure is placed on the ear, Pus draining from the infected ear.  I have rx ofloxacin otic- 5 gtts in each ear 2x a day  In certain cases swimmer's ear may progress to a more serious bacterial infection of the middle or inner ear.  If you have a fever 102 and up and significantly worsening symptoms, this could indicate a more serious infection moving to the middle/inner and needs face to face evaluation in an office by a provider.  Your symptoms should improve over the next 3 days and should resolve in about 7 days.  HOME CARE:   Wash your hands frequently.  Do not place the tip of the bottle on your ear or touch it with your fingers.  You can take Acetominophen 650 mg every 4-6 hours as needed for pain.  If pain is severe or moderate, you can apply a heating pad (set on low) or hot water bottle (wrapped in a towel) to outer ear for 20 minutes.  This will also increase drainage.  Avoid ear plugs  Do not use Q-tips  After showers, help the water run out by tilting your head to one side.  GET HELP RIGHT AWAY IF:   Fever is over 102.2 degrees.  You develop progressive ear pain or hearing loss.  Ear symptoms persist longer  than 3 days after treatment.  MAKE SURE YOU:   Understand these instructions.  Will watch your condition.  Will get help right away if you are not doing well or get worse.  TO PREVENT SWIMMER'S EAR:  Use a bathing cap or custom fitted swim molds to keep your ears dry.  Towel off after swimming to dry your ears.  Tilt your head or pull your earlobes to allow the water to escape your ear canal.  If there is still water in your ears, consider using a hairdryer on the lowest setting.  Thank you for choosing an e-visit. Your e-visit answers were reviewed by a board certified advanced clinical practitioner to complete your personal care plan. Depending upon the condition, your plan could have included both over the counter or prescription medications. Please review your pharmacy choice. Be sure that the pharmacy you have chosen is open so that you can pick up your prescription now.  If there is a problem you may message your provider in [place] to have the prescription routed to another pharmacy. Your safety is important to [place]. If you have drug allergies check your prescription carefully.  For the next 24 hours, you can use MyChart to ask questions about today's visit, request a non-urgent call back, or ask  for a work or school excuse from your e-visit provider. You will get an email in the next two days asking about your experience. I hope that your e-visit has been valuable and will speed your recovery.   5-10 minutes spent reviewing and documenting in chart.

## 2019-11-22 NOTE — Discharge Instructions (Addendum)
Please use ofloxacin ear drops as prescribed Tylenol and ibuprofen as needed for pain Continue allergy medicines Follow up in person if pain not improving

## 2019-11-29 ENCOUNTER — Encounter
Admit: 2019-11-29 | Discharge: 2019-11-29 | Disposition: A | Discharge status: Home / Self Care | Payer: PRIVATE HEALTH INSURANCE

## 2019-11-29 DIAGNOSIS — H9201 Otalgia, right ear: Principal | ICD-10-CM

## 2019-11-29 MED ORDER — AMOXICILLIN 500 MG CAPSULE
ORAL_CAPSULE | Freq: Three times a day (TID) | ORAL | 0 refills | 7 days | Status: CP
Start: 2019-11-29 — End: 2019-12-06

## 2020-01-18 ENCOUNTER — Ambulatory Visit: Admit: 2020-01-18 | Payer: PRIVATE HEALTH INSURANCE

## 2020-01-19 ENCOUNTER — Ambulatory Visit: Admit: 2020-01-19 | Payer: PRIVATE HEALTH INSURANCE | Attending: Podiatrist | Primary: Podiatrist

## 2020-03-14 ENCOUNTER — Encounter
Admit: 2020-03-14 | Discharge: 2020-03-14 | Disposition: A | Discharge status: Home / Self Care | Payer: PRIVATE HEALTH INSURANCE | Attending: Student in an Organized Health Care Education/Training Program

## 2020-03-14 DIAGNOSIS — M549 Dorsalgia, unspecified: Principal | ICD-10-CM

## 2020-03-14 MED ORDER — CYCLOBENZAPRINE 10 MG TABLET
ORAL_TABLET | Freq: Two times a day (BID) | ORAL | 0 refills | 10 days | Status: CP | PRN
Start: 2020-03-14 — End: ?

## 2020-03-14 MED ORDER — IBUPROFEN 400 MG TABLET
ORAL_TABLET | Freq: Four times a day (QID) | ORAL | 0 refills | 8 days | Status: CP | PRN
Start: 2020-03-14 — End: ?

## 2020-03-14 MED ORDER — NAPROXEN 500 MG TABLET
ORAL_TABLET | Freq: Two times a day (BID) | ORAL | 0 refills | 15 days | Status: CP
Start: 2020-03-14 — End: 2020-03-14

## 2020-04-06 ENCOUNTER — Ambulatory Visit: Payer: Medicaid Other

## 2020-04-09 ENCOUNTER — Ambulatory Visit: Payer: Medicaid Other | Attending: Internal Medicine

## 2020-04-09 DIAGNOSIS — Z23 Encounter for immunization: Secondary | ICD-10-CM

## 2020-04-09 NOTE — Progress Notes (Signed)
   Covid-19 Vaccination Clinic  Name:  Teresa Malone    MRN: [id number] DOB: [birth year]  04/09/2020  [name] was observed post Covid-19 immunization for 30 minutes based on pre-vaccination screening without incident. She was provided with Vaccine Information Sheet and instruction to access the V-Safe system.   [name] was instructed to call 911 with any severe reactions post vaccine: Marland Kitchen Difficulty breathing  . Swelling of face and throat  . A fast heartbeat  . A bad rash all over body  . Dizziness and weakness   Immunizations Administered    Name Date Dose VIS Date Route   Pfizer COVID-19 Vaccine 04/09/2020  9:18 AM 0.3 mL 10/05/2018 Intramuscular   Manufacturer: [employer]   Lot: [id number]   [place]: [id number]

## 2020-04-27 ENCOUNTER — Ambulatory Visit: Payer: Medicaid Other

## 2020-04-30 ENCOUNTER — Ambulatory Visit: Payer: Medicaid Other

## 2020-05-07 ENCOUNTER — Ambulatory Visit: Payer: Medicaid Other

## 2020-05-14 ENCOUNTER — Ambulatory Visit: Admit: 2020-05-14 | Payer: PRIVATE HEALTH INSURANCE

## 2020-05-14 ENCOUNTER — Ambulatory Visit: Payer: Medicaid Other

## 2020-05-19 ENCOUNTER — Ambulatory Visit
Admit: 2020-05-19 | Discharge: 2020-05-19 | Disposition: A | Discharge status: Home / Self Care | Payer: PRIVATE HEALTH INSURANCE | Attending: Emergency Medicine

## 2020-05-19 DIAGNOSIS — M549 Dorsalgia, unspecified: Principal | ICD-10-CM

## 2020-05-19 MED ORDER — IBUPROFEN 800 MG TABLET
ORAL_TABLET | Freq: Three times a day (TID) | ORAL | 0 refills | 10 days | Status: CP | PRN
Start: 2020-05-19 — End: ?

## 2020-05-19 MED ORDER — ACETAMINOPHEN ER 650 MG TABLET,EXTENDED RELEASE
ORAL_TABLET | Freq: Three times a day (TID) | ORAL | 0 refills | 10 days | Status: CP | PRN
Start: 2020-05-19 — End: ?

## 2020-05-19 MED ORDER — DIAZEPAM 5 MG TABLET
ORAL_TABLET | Freq: Two times a day (BID) | ORAL | 0 refills | 5.00000 days | Status: CP
Start: 2020-05-19 — End: 2020-05-24

## 2020-06-04 ENCOUNTER — Ambulatory Visit: Admit: 2020-06-04 | Discharge: 2020-06-05 | Payer: PRIVATE HEALTH INSURANCE

## 2020-06-04 DIAGNOSIS — G8929 Other chronic pain: Secondary | ICD-10-CM

## 2020-06-04 DIAGNOSIS — M5441 Lumbago with sciatica, right side: Principal | ICD-10-CM

## 2020-06-04 DIAGNOSIS — M5136 Other intervertebral disc degeneration, lumbar region: Principal | ICD-10-CM

## 2020-06-04 DIAGNOSIS — M5416 Radiculopathy, lumbar region: Principal | ICD-10-CM

## 2020-06-04 DIAGNOSIS — M549 Dorsalgia, unspecified: Principal | ICD-10-CM

## 2020-06-04 MED ORDER — METHYLPREDNISOLONE 4 MG TABLETS IN A DOSE PACK
0 refills | 0 days | Status: CP
Start: 2020-06-04 — End: ?

## 2020-06-17 ENCOUNTER — Ambulatory Visit
Admit: 2020-06-17 | Discharge: 2020-06-18 | Disposition: A | Discharge status: Home / Self Care | Payer: PRIVATE HEALTH INSURANCE | Attending: Emergency Medicine

## 2020-06-17 DIAGNOSIS — G8929 Other chronic pain: Principal | ICD-10-CM

## 2020-06-17 DIAGNOSIS — Z9049 Acquired absence of other specified parts of digestive tract: Principal | ICD-10-CM

## 2020-06-17 DIAGNOSIS — J45909 Unspecified asthma, uncomplicated: Principal | ICD-10-CM

## 2020-06-17 DIAGNOSIS — M5441 Lumbago with sciatica, right side: Principal | ICD-10-CM

## 2020-06-17 DIAGNOSIS — Z79899 Other long term (current) drug therapy: Principal | ICD-10-CM

## 2020-06-17 DIAGNOSIS — E1142 Type 2 diabetes mellitus with diabetic polyneuropathy: Principal | ICD-10-CM

## 2020-06-17 DIAGNOSIS — Z7984 Long term (current) use of oral hypoglycemic drugs: Principal | ICD-10-CM

## 2020-06-17 DIAGNOSIS — Z888 Allergy status to other drugs, medicaments and biological substances status: Principal | ICD-10-CM

## 2020-06-17 DIAGNOSIS — E669 Obesity, unspecified: Principal | ICD-10-CM

## 2020-06-17 DIAGNOSIS — F4310 Post-traumatic stress disorder, unspecified: Principal | ICD-10-CM

## 2020-06-17 DIAGNOSIS — Z885 Allergy status to narcotic agent status: Principal | ICD-10-CM

## 2020-06-17 DIAGNOSIS — Z886 Allergy status to analgesic agent status: Principal | ICD-10-CM

## 2020-06-17 DIAGNOSIS — E785 Hyperlipidemia, unspecified: Principal | ICD-10-CM

## 2020-06-17 DIAGNOSIS — M545 Acute exacerbation of chronic low back pain: Principal | ICD-10-CM

## 2020-06-17 DIAGNOSIS — F17210 Nicotine dependence, cigarettes, uncomplicated: Principal | ICD-10-CM

## 2020-06-17 MED ORDER — DIAZEPAM 5 MG TABLET
ORAL_TABLET | Freq: Two times a day (BID) | ORAL | 0 refills | 4.00000 days | Status: CP
Start: 2020-06-17 — End: 2020-06-18

## 2020-06-18 MED ORDER — DIAZEPAM 5 MG TABLET
ORAL_TABLET | Freq: Two times a day (BID) | ORAL | 0 refills | 4.00000 days | Status: CP
Start: 2020-06-18 — End: 2020-07-16

## 2020-06-22 DIAGNOSIS — M5416 Radiculopathy, lumbar region: Principal | ICD-10-CM

## 2020-06-25 MED ORDER — PREGABALIN 25 MG CAPSULE
ORAL_CAPSULE | Freq: Two times a day (BID) | ORAL | 0 refills | 30.00000 days | Status: CP
Start: 2020-06-25 — End: 2020-07-13

## 2020-06-27 ENCOUNTER — Encounter: Payer: Self-pay | Admitting: *Deleted

## 2020-06-27 ENCOUNTER — Emergency Department
Admission: EM | Admit: 2020-06-27 | Discharge: 2020-06-27 | Disposition: A | Discharge status: Home / Self Care | Payer: Medicaid Other | Attending: Emergency Medicine | Admitting: Emergency Medicine

## 2020-06-27 ENCOUNTER — Other Ambulatory Visit: Payer: Self-pay

## 2020-06-27 DIAGNOSIS — M5441 Lumbago with sciatica, right side: Secondary | ICD-10-CM | POA: Diagnosis not present

## 2020-06-27 DIAGNOSIS — J45909 Unspecified asthma, uncomplicated: Secondary | ICD-10-CM | POA: Diagnosis not present

## 2020-06-27 DIAGNOSIS — Z7951 Long term (current) use of inhaled steroids: Secondary | ICD-10-CM | POA: Diagnosis not present

## 2020-06-27 DIAGNOSIS — G8929 Other chronic pain: Secondary | ICD-10-CM | POA: Diagnosis not present

## 2020-06-27 DIAGNOSIS — M545 Low back pain, unspecified: Secondary | ICD-10-CM | POA: Diagnosis present

## 2020-06-27 DIAGNOSIS — F17210 Nicotine dependence, cigarettes, uncomplicated: Secondary | ICD-10-CM | POA: Diagnosis not present

## 2020-06-27 LAB — URINALYSIS, COMPLETE (UACMP) WITH MICROSCOPIC
Bilirubin Urine: NEGATIVE
Glucose, UA: NEGATIVE mg/dL
Ketones, ur: NEGATIVE mg/dL
Leukocytes,Ua: NEGATIVE
Nitrite: NEGATIVE
Protein, ur: NEGATIVE mg/dL
Specific Gravity, Urine: 1.023 (ref 1.005–1.030)
pH: 5 (ref 5.0–8.0)

## 2020-06-27 MED ORDER — OXYCODONE HCL ER 10 MG PO T12A
10.0000 mg | EXTENDED_RELEASE_TABLET | Freq: Two times a day (BID) | ORAL | Status: DC
  Administered 2020-06-27: 10 mg via ORAL
  Filled 2020-06-27: qty 1

## 2020-06-27 MED ORDER — METHOCARBAMOL 500 MG PO TABS: 500.0000 mg | ORAL_TABLET | Freq: Three times a day (TID) | ORAL | 0 refills | Status: DC | PRN

## 2020-06-27 MED ORDER — OXYCODONE-ACETAMINOPHEN 5-325 MG PO TABS
1.0000 | ORAL_TABLET | Freq: Once | ORAL | Status: DC
  Filled 2020-06-27: qty 1

## 2020-06-27 MED ORDER — ONDANSETRON 4 MG PO TBDP
4.0000 mg | ORAL_TABLET | Freq: Once | ORAL | Status: AC
  Administered 2020-06-27: 4 mg via ORAL
  Filled 2020-06-27: qty 1

## 2020-06-27 NOTE — Discharge Instructions (Signed)
Take Robaxin up to three times daily for the next five days.  

## 2020-06-27 NOTE — ED Notes (Signed)
POC Urine Preg result: Negative.

## 2020-06-27 NOTE — ED Notes (Signed)
Patient states she has an appointment in December for her back.

## 2020-06-27 NOTE — ED Provider Notes (Signed)
Emergency Department Provider Note  ____________________________________________  Time seen: Approximately 10:11 PM  I have reviewed the triage vital signs and the nursing notes.   HISTORY  Chief Complaint Back Pain   Historian Patient    HPI Teresa Malone is a 39 y.o. female with a history of chronic low back pain, presents to the emergency department with worsening low back pain that radiates down the right lower extremity.  Patient states that she has an MRI of her lumbar spine scheduled for next month.  She has tried baclofen at home as well as Tylenol and ibuprofen with no relief.  She denies bowel or bladder incontinence or saddle anesthesia.  No new weakness of the lower extremities.  No fever or chills.  No dysuria or increased urinary frequency.  No other alleviating measures have been attempted.    Past Medical History:  Diagnosis Date  . Asthma   . Atypical chest pain   . Bronchitis   . GERD (gastroesophageal reflux disease)   . Hypoglycemia   . Morbid obesity ([place])      Immunizations up to date:  Yes.     Past Medical History:  Diagnosis Date  . Asthma   . Atypical chest pain   . Bronchitis   . GERD (gastroesophageal reflux disease)   . Hypoglycemia   . Morbid obesity ([place])     There are no problems to display for this patient.   Past Surgical History:  Procedure Laterality Date  . CHOLECYSTECTOMY    . COLONOSCOPY WITH PROPOFOL N/A 10/27/2017   Procedure: COLONOSCOPY WITH PROPOFOL;  Surgeon: Toledo, Benay Pike, MD;  Location: [facility] ENDOSCOPY;  Service: Gastroenterology;  Laterality: N/A;  . ESOPHAGOGASTRODUODENOSCOPY (EGD) WITH PROPOFOL N/A 10/27/2017   Procedure: ESOPHAGOGASTRODUODENOSCOPY (EGD) WITH PROPOFOL;  Surgeon: Toledo, Benay Pike, MD;  Location: [facility] ENDOSCOPY;  Service: Gastroenterology;  Laterality: N/A;    Prior to Admission medications   Medication Sig Start Date End Date Taking? Authorizing Provider  albuterol (PROVENTIL HFA)  108 (90 BASE) MCG/ACT inhaler Inhale 2 puffs into the lungs every 4 (four) hours as needed for wheezing or shortness of breath. 01/10/15   Carrie Mew, MD  albuterol (PROVENTIL HFA;VENTOLIN HFA) 108 (90 Base) MCG/ACT inhaler Inhale 2 puffs into the lungs every 6 (six) hours as needed for wheezing or shortness of breath. 08/12/16   Frederich Cha, MD  ALBUTEROL SULFATE HFA IN Inhale 1 puff into the lungs 2 (two) times daily as needed (shortness of breath or wheezing).    [provider]  azithromycin (ZITHROMAX) 500 MG tablet 1 tablet daily for the next 5 days 08/12/16   Frederich Cha, MD  benzonatate (TESSALON) 200 MG capsule Take 1 capsule (200 mg total) by mouth 3 (three) times daily as needed. 08/12/16   Frederich Cha, MD  chlorpheniramine-HYDROcodone [facility] PENNKINETIC ER) 10-8 MG/5ML SUER Take 5 mLs by mouth at bedtime as needed for cough. 09/05/18   Merlyn Lot, MD  cyclobenzaprine (FLEXERIL) 5 MG tablet Take 1 tablet (5 mg total) by mouth 3 (three) times daily as needed for muscle spasms. 03/10/18   Hinda Kehr, MD  HYDROcodone-acetaminophen (NORCO/VICODIN) 5-325 MG tablet Take 1-2 tablets by mouth every 4 (four) hours as needed for moderate pain. 03/10/18   Hinda Kehr, MD  ipratropium-albuterol (DUONEB) 0.5-2.5 (3) MG/3ML SOLN Take 3 mLs by nebulization every 6 (six) hours as needed. 09/05/18   Merlyn Lot, MD  ofloxacin (FLOXIN OTIC) 0.3 % OTIC solution Place 5 drops into both ears 2 (two)  times daily. 11/22/19   Hassell Done, Mary-Margaret, FNP  omeprazole (PRILOSEC) 40 MG capsule Take 40 mg by mouth daily.    [provider]  polyethylene glycol powder (GLYCOLAX/MIRALAX) powder 1 cap full in a full glass of water, two times a day for 3 days. 05/08/17   Carrie Mew, MD  predniSONE (DELTASONE) 10 MG tablet Take 6 tabs (60 mg) PO x 3 days, then take 4 tabs (40 mg) PO x 3 days, then take 2 tabs (20 mg) PO x 3 days, then take 1 tab (10 mg) PO x 3 days, then take 1/2 tab  (5 mg) PO x 4 days. 03/10/18   Hinda Kehr, MD  predniSONE (DELTASONE) 10 MG tablet Take 1 tablet (10 mg total) by mouth daily. Day 1-2: Take 50 mg  ( 5 pills) Day 3-4 : Take 40 mg (4pills) Day 5-6: Take 30 mg (3 pills) Day 7-8:  Take 20 mg (2 pills) Day 9:  Take 10mg  (1 pill) 09/05/18   Merlyn Lot, MD  senna-docusate (SENOKOT-S) 8.6-50 MG tablet Take 2 tablets by mouth 2 (two) times daily. 05/08/17   Carrie Mew, MD    Allergies Flagyl [metronidazole], Aspirin, Ky plus spermicidal [nonoxynol 9], Meloxicam, Morphine and related, and Tramadol  Family History  Problem Relation Age of Onset  . COPD Mother     Social History Social History   Tobacco Use  . Smoking status: Current Every Day Smoker    Packs/day: 0.50    Types: Cigarettes  . Smokeless tobacco: Never Used  Substance Use Topics  . Alcohol use: Yes  . Drug use: No     Review of Systems  Constitutional: No fever/chills Eyes:  No discharge ENT: No upper respiratory complaints. Respiratory: no cough. No SOB/ use of accessory muscles to breath Gastrointestinal:   No nausea, no vomiting.  No diarrhea.  No constipation. Musculoskeletal: Patient has low back pain.  Skin: Negative for rash, abrasions, lacerations, ecchymosis.    ____________________________________________   PHYSICAL EXAM:  VITAL SIGNS: ED Triage Vitals  Enc Vitals Group     BP 06/27/20 1935 108/64     Pulse Rate 06/27/20 1935 95     Resp 06/27/20 1935 20     Temp 06/27/20 1935 99 F (37.2 C)     Temp Source 06/27/20 1935 Oral     SpO2 06/27/20 1936 97 %     Weight 06/27/20 1936 (!) 345 lb (156.5 kg)     Height 06/27/20 1936 5\' 6"  (1.676 m)     Head Circumference --      Peak Flow --      Pain Score 06/27/20 1936 10     Pain Loc --      Pain Edu? --      Excl. in [place]? --      Constitutional: Alert and oriented. Well appearing and in no acute distress. Eyes: Conjunctivae are normal. PERRL. EOMI. Head:  Atraumatic. Cardiovascular: Normal rate, regular rhythm. Normal S1 and S2.  Good peripheral circulation. Respiratory: Normal respiratory effort without tachypnea or retractions. Lungs CTAB. Good air entry to the bases with no decreased or absent breath sounds Gastrointestinal: Bowel sounds x 4 quadrants. Soft and nontender to palpation. No guarding or rigidity. No distention. Musculoskeletal: Patient has symmetric strength in the lower extremities.  Full range of motion to all extremities. No obvious deformities noted Neurologic:  Normal for age. No gross focal neurologic deficits are appreciated.  Skin:  Skin is warm, dry and intact. No  rash noted. Psychiatric: Mood and affect are normal for age. Speech and behavior are normal.   ____________________________________________   LABS (all labs ordered are listed, but only abnormal results are displayed)  Labs Reviewed  URINALYSIS, COMPLETE (UACMP) WITH MICROSCOPIC - Abnormal; Notable for the following components:      Result Value   Color, Urine YELLOW (*)    APPearance HAZY (*)    Hgb urine dipstick SMALL (*)    Bacteria, UA RARE (*)    All other components within normal limits  POC URINE PREG, ED   ____________________________________________  EKG   ____________________________________________  RADIOLOGY   No results found.  ____________________________________________    PROCEDURES  Procedure(s) performed:     Procedures     Medications  ondansetron (ZOFRAN-ODT) disintegrating tablet 4 mg (has no administration in time range)  oxyCODONE (OXYCONTIN) 12 hr tablet 10 mg (has no administration in time range)     ____________________________________________   INITIAL IMPRESSION / ASSESSMENT AND PLAN / ED COURSE  Pertinent labs & imaging results that were available during my care of the patient were reviewed by me and considered in my medical decision making (see chart for details).      Assessment and  plan Low back pain 39 year old female presents to the emergency department with low back pain with anticipated MRI next month.  Vital signs are reassuring at triage.  On physical exam, patient was alert, active and nontoxic-appearing.  Patient had symmetric strength in the lower extremities.  Explained to patient that we could not send her home with a prescription for narcotics.  Agreed to give patient OxyContin in the emergency department as she has had 4600 mg of Tylenol today.  Will also send patient home with Robaxin.  Patient was cautioned not to take baclofen and Robaxin together.  Patient recently finished a course of prednisone and I do not feel comfortable starting patient back on the steroids so soon.  All patient questions were answered.    ____________________________________________  FINAL CLINICAL IMPRESSION(S) / ED DIAGNOSES  Final diagnoses:  Chronic right-sided low back pain with right-sided sciatica      NEW MEDICATIONS STARTED DURING THIS VISIT:  ED Discharge Orders    None          This chart was dictated using voice recognition software/Dragon. Despite best efforts to proofread, errors can occur which can change the meaning. Any change was purely unintentional.     Lannie Fields, PA-C 06/27/20 2217    Blake Divine, MD 06/30/20 1315

## 2020-06-27 NOTE — ED Notes (Signed)
Patient given Ed sandwich tray per request.

## 2020-06-27 NOTE — ED Notes (Signed)
Patient declined discharge vital signs. 

## 2020-06-27 NOTE — ED Triage Notes (Signed)
Pt brought in via ems from home.  Pt has back pain.  No known injury.  No urinary sx.  Pt states she has a herniated disc.  Pt alert.  Pt taking meds without pain relief.  Pt alert  Speech clear

## 2020-06-28 MED ORDER — METHYLPREDNISOLONE 4 MG TABLETS IN A DOSE PACK
0 refills | 0 days | Status: CP
Start: 2020-06-28 — End: 2020-07-16

## 2020-06-28 MED ORDER — METHOCARBAMOL 500 MG TABLET
ORAL_TABLET | Freq: Two times a day (BID) | ORAL | 0 refills | 30 days | Status: CP | PRN
Start: 2020-06-28 — End: 2020-07-28

## 2020-07-12 ENCOUNTER — Ambulatory Visit: Admit: 2020-07-12 | Discharge: 2020-07-13 | Payer: PRIVATE HEALTH INSURANCE

## 2020-07-12 DIAGNOSIS — M5441 Lumbago with sciatica, right side: Principal | ICD-10-CM

## 2020-07-12 DIAGNOSIS — M5416 Radiculopathy, lumbar region: Principal | ICD-10-CM

## 2020-07-12 DIAGNOSIS — G8929 Other chronic pain: Principal | ICD-10-CM

## 2020-07-13 MED ORDER — PREGABALIN 75 MG CAPSULE
ORAL_CAPSULE | Freq: Two times a day (BID) | ORAL | 0 refills | 30.00000 days | Status: CP
Start: 2020-07-13 — End: 2020-08-12

## 2020-07-16 ENCOUNTER — Ambulatory Visit: Admit: 2020-07-16 | Discharge: 2020-07-17 | Payer: PRIVATE HEALTH INSURANCE

## 2020-07-16 DIAGNOSIS — M5126 Other intervertebral disc displacement, lumbar region: Principal | ICD-10-CM

## 2020-07-16 DIAGNOSIS — M48061 Spinal stenosis, lumbar region without neurogenic claudication: Principal | ICD-10-CM

## 2020-07-16 DIAGNOSIS — M5136 Other intervertebral disc degeneration, lumbar region: Principal | ICD-10-CM

## 2020-07-16 DIAGNOSIS — M5416 Radiculopathy, lumbar region: Principal | ICD-10-CM

## 2020-07-26 ENCOUNTER — Ambulatory Visit: Admit: 2020-07-26 | Discharge: 2020-07-27 | Payer: PRIVATE HEALTH INSURANCE

## 2020-07-26 DIAGNOSIS — M5126 Other intervertebral disc displacement, lumbar region: Principal | ICD-10-CM

## 2020-07-26 DIAGNOSIS — M5416 Radiculopathy, lumbar region: Principal | ICD-10-CM

## 2020-07-26 DIAGNOSIS — M48061 Spinal stenosis, lumbar region without neurogenic claudication: Principal | ICD-10-CM

## 2020-07-31 MED ORDER — PREGABALIN 75 MG CAPSULE
ORAL_CAPSULE | Freq: Three times a day (TID) | ORAL | 3 refills | 30 days | Status: CP
Start: 2020-07-31 — End: 2020-08-30

## 2020-08-11 DIAGNOSIS — Z419 Encounter for procedure for purposes other than remedying health state, unspecified: Secondary | ICD-10-CM | POA: Diagnosis not present

## 2020-08-15 DIAGNOSIS — M5416 Radiculopathy, lumbar region: Secondary | ICD-10-CM | POA: Diagnosis not present

## 2020-08-15 MED ORDER — IBUPROFEN 800 MG TABLET
ORAL_TABLET | Freq: Three times a day (TID) | ORAL | 1 refills | 30.00000 days | Status: CP
Start: 2020-08-15 — End: 2020-10-14

## 2020-08-17 DIAGNOSIS — F332 Major depressive disorder, recurrent severe without psychotic features: Secondary | ICD-10-CM | POA: Diagnosis not present

## 2020-08-17 DIAGNOSIS — F411 Generalized anxiety disorder: Secondary | ICD-10-CM | POA: Diagnosis not present

## 2020-08-21 DIAGNOSIS — F331 Major depressive disorder, recurrent, moderate: Secondary | ICD-10-CM | POA: Diagnosis not present

## 2020-08-23 DIAGNOSIS — M5459 Discogenic lumbar pain: Principal | ICD-10-CM

## 2020-08-23 DIAGNOSIS — M5416 Radiculopathy, lumbar region: Principal | ICD-10-CM

## 2020-08-24 ENCOUNTER — Ambulatory Visit: Admit: 2020-08-24 | Discharge: 2020-08-24 | Payer: PRIVATE HEALTH INSURANCE

## 2020-08-24 ENCOUNTER — Ambulatory Visit
Admit: 2020-08-24 | Discharge: 2020-08-24 | Payer: PRIVATE HEALTH INSURANCE | Attending: Physical Medicine & Rehabilitation | Primary: Physical Medicine & Rehabilitation

## 2020-08-26 DIAGNOSIS — F411 Generalized anxiety disorder: Secondary | ICD-10-CM | POA: Diagnosis not present

## 2020-08-26 DIAGNOSIS — F332 Major depressive disorder, recurrent severe without psychotic features: Secondary | ICD-10-CM | POA: Diagnosis not present

## 2020-09-04 DIAGNOSIS — F331 Major depressive disorder, recurrent, moderate: Secondary | ICD-10-CM | POA: Diagnosis not present

## 2020-09-08 DIAGNOSIS — F411 Generalized anxiety disorder: Secondary | ICD-10-CM | POA: Diagnosis not present

## 2020-09-11 DIAGNOSIS — Z419 Encounter for procedure for purposes other than remedying health state, unspecified: Secondary | ICD-10-CM | POA: Diagnosis not present

## 2020-09-11 DIAGNOSIS — U071 COVID-19: Secondary | ICD-10-CM

## 2020-09-11 HISTORY — DX: COVID-19: U07.1

## 2020-09-14 ENCOUNTER — Ambulatory Visit
Admit: 2020-09-14 | Discharge: 2020-09-14 | Payer: PRIVATE HEALTH INSURANCE | Attending: Physical Medicine & Rehabilitation | Primary: Physical Medicine & Rehabilitation

## 2020-09-14 ENCOUNTER — Ambulatory Visit: Admit: 2020-09-14 | Discharge: 2020-09-14 | Payer: PRIVATE HEALTH INSURANCE

## 2020-09-14 DIAGNOSIS — Z20822 Contact with and (suspected) exposure to covid-19: Secondary | ICD-10-CM | POA: Diagnosis not present

## 2020-09-15 DIAGNOSIS — F411 Generalized anxiety disorder: Secondary | ICD-10-CM | POA: Diagnosis not present

## 2020-09-17 ENCOUNTER — Emergency Department
Admission: EM | Admit: 2020-09-17 | Discharge: 2020-09-17 | Disposition: A | Discharge status: Home / Self Care | Payer: Medicaid Other | Attending: Emergency Medicine | Admitting: Emergency Medicine

## 2020-09-17 ENCOUNTER — Encounter: Payer: Self-pay | Admitting: Emergency Medicine

## 2020-09-17 ENCOUNTER — Emergency Department: Payer: Medicaid Other

## 2020-09-17 ENCOUNTER — Other Ambulatory Visit: Payer: Self-pay

## 2020-09-17 DIAGNOSIS — F17210 Nicotine dependence, cigarettes, uncomplicated: Secondary | ICD-10-CM | POA: Diagnosis not present

## 2020-09-17 DIAGNOSIS — J45909 Unspecified asthma, uncomplicated: Secondary | ICD-10-CM | POA: Diagnosis not present

## 2020-09-17 DIAGNOSIS — R079 Chest pain, unspecified: Secondary | ICD-10-CM | POA: Diagnosis present

## 2020-09-17 DIAGNOSIS — R0602 Shortness of breath: Secondary | ICD-10-CM | POA: Diagnosis not present

## 2020-09-17 DIAGNOSIS — U071 COVID-19: Secondary | ICD-10-CM | POA: Diagnosis not present

## 2020-09-17 LAB — CBC
HCT: 43.8 % (ref 36.0–46.0)
Hemoglobin: 14.7 g/dL (ref 12.0–15.0)
MCH: 30.1 pg (ref 26.0–34.0)
MCHC: 33.6 g/dL (ref 30.0–36.0)
MCV: 89.8 fL (ref 80.0–100.0)
Platelets: 263 10*3/uL (ref 150–400)
RBC: 4.88 MIL/uL (ref 3.87–5.11)
RDW: 13.3 % (ref 11.5–15.5)
WBC: 9.2 10*3/uL (ref 4.0–10.5)
nRBC: 0 % (ref 0.0–0.2)

## 2020-09-17 LAB — BASIC METABOLIC PANEL
Anion gap: 10 (ref 5–15)
BUN: 13 mg/dL (ref 6–20)
CO2: 25 mmol/L (ref 22–32)
Calcium: 8.4 mg/dL — ABNORMAL LOW (ref 8.9–10.3)
Chloride: 104 mmol/L (ref 98–111)
Creatinine, Ser: 0.54 mg/dL (ref 0.44–1.00)
GFR, Estimated: >60 mL/min (ref >60)
Glucose, Bld: 161 mg/dL — ABNORMAL HIGH (ref 70–99)
Potassium: 3.6 mmol/L (ref 3.5–5.1)
Sodium: 139 mmol/L (ref 135–145)

## 2020-09-17 LAB — TROPONIN I (HIGH SENSITIVITY): Troponin I (High Sensitivity): 2 ng/L (ref <18)

## 2020-09-17 MED ORDER — HYDROCOD POLST-CPM POLST ER 10-8 MG/5ML PO SUER
5.0000 mL | Freq: Once | ORAL | Status: AC
  Administered 2020-09-17: 5 mL via ORAL
  Filled 2020-09-17: qty 5

## 2020-09-17 MED ORDER — HYDROCOD POLST-CPM POLST ER 10-8 MG/5ML PO SUER: 5.0000 mL | Freq: Two times a day (BID) | ORAL | 0 refills | Status: DC

## 2020-09-17 MED ORDER — ONDANSETRON 4 MG PO TBDP
ORAL_TABLET | ORAL | Status: AC
  Filled 2020-09-17: qty 1

## 2020-09-17 MED ORDER — ALBUTEROL SULFATE (2.5 MG/3ML) 0.083% IN NEBU: 2.5000 mg | INHALATION_SOLUTION | RESPIRATORY_TRACT | 0 refills | Status: DC | PRN

## 2020-09-17 MED ORDER — AZITHROMYCIN 500 MG PO TABS
500.0000 mg | ORAL_TABLET | Freq: Once | ORAL | Status: AC
  Administered 2020-09-17: 500 mg via ORAL
  Filled 2020-09-17: qty 1

## 2020-09-17 MED ORDER — PREDNISONE 20 MG PO TABS
60.0000 mg | ORAL_TABLET | Freq: Once | ORAL | Status: AC
  Administered 2020-09-17: 60 mg via ORAL
  Filled 2020-09-17: qty 3

## 2020-09-17 MED ORDER — ONDANSETRON 4 MG PO TBDP
4.0000 mg | ORAL_TABLET | Freq: Once | ORAL | Status: AC
  Administered 2020-09-17: 4 mg via ORAL

## 2020-09-17 MED ORDER — ONDANSETRON 4 MG PO TBDP: 4.0000 mg | ORAL_TABLET | Freq: Three times a day (TID) | ORAL | 0 refills | Status: DC | PRN

## 2020-09-17 MED ORDER — AZITHROMYCIN 250 MG PO TABS: 250.0000 mg | ORAL_TABLET | Freq: Every day | ORAL | 0 refills | Status: DC

## 2020-09-17 MED ORDER — ALBUTEROL SULFATE HFA 108 (90 BASE) MCG/ACT IN AERS: 2.0000 | INHALATION_SPRAY | RESPIRATORY_TRACT | 0 refills | Status: DC | PRN

## 2020-09-17 MED ORDER — PREDNISONE 20 MG PO TABS: ORAL_TABLET | ORAL | 0 refills | Status: DC

## 2020-09-17 NOTE — Discharge Instructions (Signed)
1. Take antibiotic and steroid daily x4 days. Start your next dose Tuesday morning. 2. You may take Tussionex as needed for cough. 3. Use your albuterol inhaler or nebulizer every 4 hours as needed for difficulty breathing. 4. Return to the ER for worsening symptoms, persistent vomiting, difficulty breathing or other concerns.

## 2020-09-17 NOTE — ED Triage Notes (Signed)
Patient with complaint of chest pain and shortness of breath that started today. Patient states that she is also having some nausea. Patient states that she was tested for Covid on Thursday and received the results today.

## 2020-09-17 NOTE — ED Provider Notes (Signed)
[facility] Emergency Department Provider Note   ____________________________________________   Event Date/Time   First MD Initiated Contact with Patient 09/17/20 [phone]     (approximate)  I have reviewed the triage vital signs and the nursing notes.   HISTORY  Chief Complaint Chest Pain and Shortness of Breath    HPI Teresa Malone is a 40 y.o. female who presents to the ED from home with a chief complaint of chest pain and shortness of breath. Patient was tested for COVID-19 on 08/13/2020 and found out today that she is positive. She is partially vaccinated, having taken 1/2 Pfizer vaccine doses several months ago and "never made it back for the second". Reports a 1.5-day history of chest pain on coughing, body aches and nasal congestion. Denies abdominal pain, nausea, vomiting, dysuria or diarrhea. Reports she is out of her albuterol inhaler and nebulizer medicines.     Past Medical History:  Diagnosis Date  . Asthma   . Atypical chest pain   . Bronchitis   . GERD (gastroesophageal reflux disease)   . Hypoglycemia   . Morbid obesity ([place])     There are no problems to display for this patient.   Past Surgical History:  Procedure Laterality Date  . CHOLECYSTECTOMY    . COLONOSCOPY WITH PROPOFOL N/A 10/27/2017   Procedure: COLONOSCOPY WITH PROPOFOL;  Surgeon: Toledo, Benay Pike, MD;  Location: [facility] ENDOSCOPY;  Service: Gastroenterology;  Laterality: N/A;  . ESOPHAGOGASTRODUODENOSCOPY (EGD) WITH PROPOFOL N/A 10/27/2017   Procedure: ESOPHAGOGASTRODUODENOSCOPY (EGD) WITH PROPOFOL;  Surgeon: Toledo, Benay Pike, MD;  Location: [facility] ENDOSCOPY;  Service: Gastroenterology;  Laterality: N/A;    Prior to Admission medications   Medication Sig Start Date End Date Taking? Authorizing Provider  albuterol (PROVENTIL) (2.5 MG/3ML) 0.083% nebulizer solution Take 3 mLs (2.5 mg total) by nebulization every 4 (four) hours as needed for wheezing or shortness of  breath. 09/17/20  Yes Paulette Blanch, MD  albuterol (VENTOLIN HFA) 108 (90 Base) MCG/ACT inhaler Inhale 2 puffs into the lungs every 4 (four) hours as needed for wheezing or shortness of breath. 09/17/20  Yes Paulette Blanch, MD  azithromycin (ZITHROMAX) 250 MG tablet Take 1 tablet (250 mg total) by mouth daily. 09/17/20  Yes Paulette Blanch, MD  chlorpheniramine-HYDROcodone [facility] PENNKINETIC ER) 10-8 MG/5ML SUER Take 5 mLs by mouth 2 (two) times daily. 09/17/20  Yes Paulette Blanch, MD  predniSONE (DELTASONE) 20 MG tablet 3 tablets daily x 4 days 09/17/20  Yes Paulette Blanch, MD  benzonatate (TESSALON) 200 MG capsule Take 1 capsule (200 mg total) by mouth 3 (three) times daily as needed. 08/12/16   Frederich Cha, MD  cyclobenzaprine (FLEXERIL) 5 MG tablet Take 1 tablet (5 mg total) by mouth 3 (three) times daily as needed for muscle spasms. 03/10/18   Hinda Kehr, MD  HYDROcodone-acetaminophen (NORCO/VICODIN) 5-325 MG tablet Take 1-2 tablets by mouth every 4 (four) hours as needed for moderate pain. 03/10/18   Hinda Kehr, MD  ipratropium-albuterol (DUONEB) 0.5-2.5 (3) MG/3ML SOLN Take 3 mLs by nebulization every 6 (six) hours as needed. 09/05/18   Merlyn Lot, MD  ofloxacin (FLOXIN OTIC) 0.3 % OTIC solution Place 5 drops into both ears 2 (two) times daily. 11/22/19   Hassell Done, Mary-Margaret, FNP  omeprazole (PRILOSEC) 40 MG capsule Take 40 mg by mouth daily.    [provider]  polyethylene glycol powder (GLYCOLAX/MIRALAX) powder 1 cap full in a full glass of water, two times a day  for 3 days. 05/08/17   Carrie Mew, MD  senna-docusate (SENOKOT-S) 8.6-50 MG tablet Take 2 tablets by mouth 2 (two) times daily. 05/08/17   Carrie Mew, MD    Allergies Flagyl [metronidazole], Aspirin, Ky plus spermicidal [nonoxynol 9], Meloxicam, Morphine and related, and Tramadol  Family History  Problem Relation Age of Onset  . COPD Mother     Social History Social History   Tobacco Use  . Smoking status:  Current Every Day Smoker    Packs/day: 0.50    Types: Cigarettes  . Smokeless tobacco: Never Used  Substance Use Topics  . Alcohol use: Not Currently  . Drug use: Yes    Types: Marijuana    Review of Systems  Constitutional: No fever/chills Eyes: No visual changes. ENT: No sore throat. Cardiovascular: Positive for chest pain. Respiratory: Positive for cough and shortness of breath. Gastrointestinal: No abdominal pain.  No nausea, no vomiting.  No diarrhea.  No constipation. Genitourinary: Negative for dysuria. Musculoskeletal: Negative for back pain. Skin: Negative for rash. Neurological: Negative for headaches, focal weakness or numbness.   ____________________________________________   PHYSICAL EXAM:  VITAL SIGNS: ED Triage Vitals [09/17/20 0130]  Enc Vitals Group     BP 134/64     Pulse Rate 77     Resp 18     Temp 97.6 F (36.4 C)     Temp Source Oral     SpO2 96 %     Weight (!) 360 lb (163.3 kg)     Height 5\' 7"  (1.702 m)     Head Circumference      Peak Flow      Pain Score 7     Pain Loc      Pain Edu?      Excl. in [place]?     Constitutional: Alert and oriented. Well appearing and in no acute distress. Eyes: Conjunctivae are normal. PERRL. EOMI. Head: Atraumatic. Nose: Congestion/rhinnorhea. Mouth/Throat: Mucous membranes are moist.   Neck: No stridor.   Cardiovascular: Normal rate, regular rhythm. Grossly normal heart sounds.  Good peripheral circulation. Respiratory: Normal respiratory effort.  No retractions. Lungs CTAB. Gastrointestinal: Obese. Soft and nontender. No distention. No abdominal bruits. No CVA tenderness. Musculoskeletal: No lower extremity tenderness nor edema.  No joint effusions. Neurologic:  Normal speech and language. No gross focal neurologic deficits are appreciated. No gait instability. Skin:  Skin is warm, dry and intact. No rash noted. Psychiatric: Mood and affect are normal. Speech and behavior are  normal.  ____________________________________________   LABS (all labs ordered are listed, but only abnormal results are displayed)  Labs Reviewed  BASIC METABOLIC PANEL - Abnormal; Notable for the following components:      Result Value   Glucose, Bld 161 (*)    Calcium 8.4 (*)    All other components within normal limits  CBC  TROPONIN I (HIGH SENSITIVITY)   ____________________________________________  EKG  ED ECG REPORT I, Langley Flatley J, the attending physician, personally viewed and interpreted this ECG.  Date: 09/17/2020  EKG Time: 0126  Rate: 73  Rhythm: normal EKG, normal sinus rhythm  Axis: Normal  Intervals:none  ST&T Change: Nonspecific  ____________________________________________  RADIOLOGY I, Zarinah Oviatt J, personally viewed and evaluated these images (plain radiographs) as part of my medical decision making, as well as reviewing the written report by the radiologist.  ED MD interpretation: Bronchitis  Official radiology report(s): DG Chest 2 View  Result Date: 09/17/2020 CLINICAL DATA:  Shortness of breath EXAM: CHEST - 2  VIEW COMPARISON:  None. FINDINGS: The heart size and mediastinal contours are within normal limits. There are mildly increased interstitial markings seen within the perihilar region with reticulonodular opacities. The visualized skeletal structures are unremarkable. IMPRESSION: Findings which may be suggestive of bronchitis. Electronically Signed   By: Prudencio Pair M.D.   On: 09/17/2020 02:00    ____________________________________________   PROCEDURES  Procedure(s) performed (including Critical Care):  Procedures   ____________________________________________   INITIAL IMPRESSION / ASSESSMENT AND PLAN / ED COURSE  As part of my medical decision making, I reviewed the following data within the [place] notes reviewed and incorporated, Labs reviewed, EKG interpreted, Old chart reviewed, Radiograph reviewed  and Notes from prior ED visits     39 year old female with recently diagnosed COVID-19 presenting with chest pain and shortness of breath. Differential includes, but is not limited to, viral syndrome, bronchitis including COPD exacerbation, pneumonia, reactive airway disease including asthma, CHF including exacerbation with or without pulmonary/interstitial edema, pneumothorax, ACS, thoracic trauma, and pulmonary embolism.  Laboratory results, EKG and chest x-ray unremarkable. Patient is not tachycardic, hemodynamically stable, hypoxic nor tachypneic. Low suspicion for PE. Will treat symptomatically with prednisone, azithromycin, Tussionex and albuterol. Will refer to outpatient COVID treatment center for possible treatment. Strict return precautions given. Patient verbalizes understanding and agrees with plan of care.     ____________________________________________   FINAL CLINICAL IMPRESSION(S) / ED DIAGNOSES  Final diagnoses:  COVID-19     ED Discharge Orders         Ordered    albuterol (VENTOLIN HFA) 108 (90 Base) MCG/ACT inhaler  Every 4 hours PRN        09/17/20 0354    albuterol (PROVENTIL) (2.5 MG/3ML) 0.083% nebulizer solution  Every 4 hours PRN        09/17/20 0354    predniSONE (DELTASONE) 20 MG tablet        09/17/20 0354    chlorpheniramine-HYDROcodone (TUSSIONEX PENNKINETIC ER) 10-8 MG/5ML SUER  2 times daily        09/17/20 0354    azithromycin (ZITHROMAX) 250 MG tablet  Daily        09/17/20 0354    Ambulatory referral for Covid Treatment        09/17/20 0356          *Please note:  Teresa Malone was evaluated in Emergency Department on 09/17/2020 for the symptoms described in the history of present illness. She was evaluated in the context of the global COVID-19 pandemic, which necessitated consideration that the patient might be at risk for infection with the SARS-CoV-2 virus that causes COVID-19. Institutional protocols and algorithms that pertain to  the evaluation of patients at risk for COVID-19 are in a state of rapid change based on information released by regulatory bodies including the CDC and federal and state organizations. These policies and algorithms were followed during the patient's care in the ED.  Some ED evaluations and interventions may be delayed as a result of limited staffing during and the pandemic.*   Note:  This document was prepared using Dragon voice recognition software and may include unintentional dictation errors.   Paulette Blanch, MD 09/17/20 [phone]

## 2020-09-18 ENCOUNTER — Encounter: Payer: Self-pay | Admitting: Nurse Practitioner

## 2020-09-18 ENCOUNTER — Telehealth: Payer: Self-pay | Admitting: Nurse Practitioner

## 2020-09-18 DIAGNOSIS — F331 Major depressive disorder, recurrent, moderate: Secondary | ICD-10-CM | POA: Diagnosis not present

## 2020-09-18 NOTE — Telephone Encounter (Signed)
I called [facility] to discuss Covid symptoms and the use of Sotrovimab, a monoclonal antibody infusion for those with mild to moderate Covid symptoms and at a high risk of hospitalization.     Pt does not qualify for infusion therapy as pt's symptoms first presented > 7 days prior to timing of infusion. Symptoms tier reviewed as well as criteria for ending isolation. Preventative practices reviewed. Patient verbalized understanding    Murray Hodgkins, NP

## 2020-09-22 DIAGNOSIS — F411 Generalized anxiety disorder: Secondary | ICD-10-CM | POA: Diagnosis not present

## 2020-10-02 DIAGNOSIS — Z1389 Encounter for screening for other disorder: Secondary | ICD-10-CM | POA: Diagnosis not present

## 2020-10-02 DIAGNOSIS — N3000 Acute cystitis without hematuria: Secondary | ICD-10-CM | POA: Diagnosis not present

## 2020-10-02 DIAGNOSIS — Z1159 Encounter for screening for other viral diseases: Secondary | ICD-10-CM | POA: Diagnosis not present

## 2020-10-04 ENCOUNTER — Ambulatory Visit: Admit: 2020-10-04 | Discharge: 2020-10-05 | Payer: PRIVATE HEALTH INSURANCE

## 2020-10-04 ENCOUNTER — Ambulatory Visit
Admit: 2020-10-04 | Discharge: 2020-10-05 | Payer: PRIVATE HEALTH INSURANCE | Attending: Physical Medicine & Rehabilitation | Primary: Physical Medicine & Rehabilitation

## 2020-10-04 DIAGNOSIS — M5416 Radiculopathy, lumbar region: Principal | ICD-10-CM

## 2020-10-06 DIAGNOSIS — F332 Major depressive disorder, recurrent severe without psychotic features: Secondary | ICD-10-CM | POA: Diagnosis not present

## 2020-10-06 DIAGNOSIS — F411 Generalized anxiety disorder: Secondary | ICD-10-CM | POA: Diagnosis not present

## 2020-10-09 DIAGNOSIS — Z23 Encounter for immunization: Secondary | ICD-10-CM | POA: Diagnosis not present

## 2020-10-09 DIAGNOSIS — Z419 Encounter for procedure for purposes other than remedying health state, unspecified: Secondary | ICD-10-CM | POA: Diagnosis not present

## 2020-10-13 DIAGNOSIS — F411 Generalized anxiety disorder: Secondary | ICD-10-CM | POA: Diagnosis not present

## 2020-10-13 DIAGNOSIS — F332 Major depressive disorder, recurrent severe without psychotic features: Secondary | ICD-10-CM | POA: Diagnosis not present

## 2020-10-19 DIAGNOSIS — F332 Major depressive disorder, recurrent severe without psychotic features: Secondary | ICD-10-CM | POA: Diagnosis not present

## 2020-10-19 DIAGNOSIS — F411 Generalized anxiety disorder: Secondary | ICD-10-CM | POA: Diagnosis not present

## 2020-10-21 ENCOUNTER — Ambulatory Visit
Admit: 2020-10-21 | Discharge: 2020-10-22 | Disposition: A | Discharge status: Home / Self Care | Payer: PRIVATE HEALTH INSURANCE

## 2020-10-21 ENCOUNTER — Emergency Department
Admit: 2020-10-21 | Discharge: 2020-10-22 | Disposition: A | Discharge status: Home / Self Care | Payer: PRIVATE HEALTH INSURANCE

## 2020-10-21 DIAGNOSIS — N3000 Acute cystitis without hematuria: Secondary | ICD-10-CM | POA: Diagnosis not present

## 2020-10-21 DIAGNOSIS — R110 Nausea: Secondary | ICD-10-CM | POA: Diagnosis not present

## 2020-10-21 DIAGNOSIS — N390 Urinary tract infection, site not specified: Secondary | ICD-10-CM | POA: Diagnosis not present

## 2020-10-21 DIAGNOSIS — R300 Dysuria: Secondary | ICD-10-CM | POA: Diagnosis not present

## 2020-10-21 DIAGNOSIS — R109 Unspecified abdominal pain: Secondary | ICD-10-CM | POA: Diagnosis not present

## 2020-10-21 DIAGNOSIS — R1030 Lower abdominal pain, unspecified: Secondary | ICD-10-CM | POA: Diagnosis not present

## 2020-10-22 DIAGNOSIS — N390 Urinary tract infection, site not specified: Secondary | ICD-10-CM | POA: Diagnosis not present

## 2020-10-22 DIAGNOSIS — R109 Unspecified abdominal pain: Secondary | ICD-10-CM | POA: Diagnosis not present

## 2020-10-22 MED ORDER — FLUCONAZOLE 150 MG TABLET
ORAL_TABLET | 0 refills | 0.00000 days | Status: CP
Start: 2020-10-22 — End: 2020-10-22

## 2020-10-22 MED ORDER — CEPHALEXIN 500 MG CAPSULE
ORAL_CAPSULE | Freq: Two times a day (BID) | ORAL | 0 refills | 7 days | Status: CP
Start: 2020-10-22 — End: 2020-10-29

## 2020-10-23 DIAGNOSIS — Z6841 Body Mass Index (BMI) 40.0 and over, adult: Principal | ICD-10-CM

## 2020-10-27 DIAGNOSIS — F332 Major depressive disorder, recurrent severe without psychotic features: Secondary | ICD-10-CM | POA: Diagnosis not present

## 2020-10-27 DIAGNOSIS — F411 Generalized anxiety disorder: Secondary | ICD-10-CM | POA: Diagnosis not present

## 2020-11-03 DIAGNOSIS — F332 Major depressive disorder, recurrent severe without psychotic features: Secondary | ICD-10-CM | POA: Diagnosis not present

## 2020-11-03 DIAGNOSIS — F411 Generalized anxiety disorder: Secondary | ICD-10-CM | POA: Diagnosis not present

## 2020-11-09 DIAGNOSIS — M5416 Radiculopathy, lumbar region: Principal | ICD-10-CM

## 2020-11-09 DIAGNOSIS — Z419 Encounter for procedure for purposes other than remedying health state, unspecified: Secondary | ICD-10-CM | POA: Diagnosis not present

## 2020-11-14 MED ORDER — IBUPROFEN 800 MG TABLET
ORAL_TABLET | Freq: Three times a day (TID) | ORAL | 1 refills | 30 days | Status: CP
Start: 2020-11-14 — End: 2021-01-13

## 2020-11-17 DIAGNOSIS — F411 Generalized anxiety disorder: Secondary | ICD-10-CM | POA: Diagnosis not present

## 2020-11-17 DIAGNOSIS — F332 Major depressive disorder, recurrent severe without psychotic features: Secondary | ICD-10-CM | POA: Diagnosis not present

## 2020-11-23 DIAGNOSIS — F411 Generalized anxiety disorder: Secondary | ICD-10-CM | POA: Diagnosis not present

## 2020-11-23 DIAGNOSIS — F332 Major depressive disorder, recurrent severe without psychotic features: Secondary | ICD-10-CM | POA: Diagnosis not present

## 2020-11-30 DIAGNOSIS — F411 Generalized anxiety disorder: Secondary | ICD-10-CM | POA: Diagnosis not present

## 2020-11-30 DIAGNOSIS — F332 Major depressive disorder, recurrent severe without psychotic features: Secondary | ICD-10-CM | POA: Diagnosis not present

## 2020-12-07 DIAGNOSIS — F332 Major depressive disorder, recurrent severe without psychotic features: Secondary | ICD-10-CM | POA: Diagnosis not present

## 2020-12-07 DIAGNOSIS — F411 Generalized anxiety disorder: Secondary | ICD-10-CM | POA: Diagnosis not present

## 2020-12-09 DIAGNOSIS — Z419 Encounter for procedure for purposes other than remedying health state, unspecified: Secondary | ICD-10-CM | POA: Diagnosis not present

## 2020-12-10 DIAGNOSIS — E119 Type 2 diabetes mellitus without complications: Secondary | ICD-10-CM | POA: Diagnosis not present

## 2020-12-10 DIAGNOSIS — Z0000 Encounter for general adult medical examination without abnormal findings: Secondary | ICD-10-CM | POA: Diagnosis not present

## 2020-12-10 DIAGNOSIS — J309 Allergic rhinitis, unspecified: Secondary | ICD-10-CM | POA: Diagnosis not present

## 2020-12-10 DIAGNOSIS — L732 Hidradenitis suppurativa: Secondary | ICD-10-CM | POA: Diagnosis not present

## 2020-12-12 DIAGNOSIS — M5416 Radiculopathy, lumbar region: Principal | ICD-10-CM

## 2020-12-12 MED ORDER — PREGABALIN 75 MG CAPSULE
ORAL_CAPSULE | Freq: Three times a day (TID) | ORAL | 0 refills | 30.00000 days | Status: CP
Start: 2020-12-12 — End: 2021-01-11

## 2020-12-14 DIAGNOSIS — F411 Generalized anxiety disorder: Secondary | ICD-10-CM | POA: Diagnosis not present

## 2020-12-14 DIAGNOSIS — F332 Major depressive disorder, recurrent severe without psychotic features: Secondary | ICD-10-CM | POA: Diagnosis not present

## 2020-12-22 DIAGNOSIS — F332 Major depressive disorder, recurrent severe without psychotic features: Secondary | ICD-10-CM | POA: Diagnosis not present

## 2020-12-22 DIAGNOSIS — F411 Generalized anxiety disorder: Secondary | ICD-10-CM | POA: Diagnosis not present

## 2020-12-29 DIAGNOSIS — F332 Major depressive disorder, recurrent severe without psychotic features: Secondary | ICD-10-CM | POA: Diagnosis not present

## 2020-12-29 DIAGNOSIS — F411 Generalized anxiety disorder: Secondary | ICD-10-CM | POA: Diagnosis not present

## 2021-01-05 DIAGNOSIS — F411 Generalized anxiety disorder: Secondary | ICD-10-CM | POA: Diagnosis not present

## 2021-01-05 DIAGNOSIS — F332 Major depressive disorder, recurrent severe without psychotic features: Secondary | ICD-10-CM | POA: Diagnosis not present

## 2021-01-09 DIAGNOSIS — Z419 Encounter for procedure for purposes other than remedying health state, unspecified: Secondary | ICD-10-CM | POA: Diagnosis not present

## 2021-01-12 DIAGNOSIS — F332 Major depressive disorder, recurrent severe without psychotic features: Secondary | ICD-10-CM | POA: Diagnosis not present

## 2021-01-12 DIAGNOSIS — F411 Generalized anxiety disorder: Secondary | ICD-10-CM | POA: Diagnosis not present

## 2021-01-19 DIAGNOSIS — F332 Major depressive disorder, recurrent severe without psychotic features: Secondary | ICD-10-CM | POA: Diagnosis not present

## 2021-01-19 DIAGNOSIS — F411 Generalized anxiety disorder: Secondary | ICD-10-CM | POA: Diagnosis not present

## 2021-01-26 DIAGNOSIS — F332 Major depressive disorder, recurrent severe without psychotic features: Secondary | ICD-10-CM | POA: Diagnosis not present

## 2021-01-26 DIAGNOSIS — F411 Generalized anxiety disorder: Secondary | ICD-10-CM | POA: Diagnosis not present

## 2021-02-02 DIAGNOSIS — F411 Generalized anxiety disorder: Secondary | ICD-10-CM | POA: Diagnosis not present

## 2021-02-02 DIAGNOSIS — F332 Major depressive disorder, recurrent severe without psychotic features: Secondary | ICD-10-CM | POA: Diagnosis not present

## 2021-02-05 ENCOUNTER — Ambulatory Visit
Admit: 2021-02-05 | Discharge: 2021-02-06 | Payer: PRIVATE HEALTH INSURANCE | Attending: Physician Assistant | Primary: Physician Assistant

## 2021-02-05 ENCOUNTER — Ambulatory Visit: Admit: 2021-02-05 | Discharge: 2021-02-06 | Payer: PRIVATE HEALTH INSURANCE

## 2021-02-05 DIAGNOSIS — Z6841 Body Mass Index (BMI) 40.0 and over, adult: Principal | ICD-10-CM

## 2021-02-05 DIAGNOSIS — L732 Hidradenitis suppurativa: Principal | ICD-10-CM

## 2021-02-05 DIAGNOSIS — G4733 Obstructive sleep apnea (adult) (pediatric): Principal | ICD-10-CM

## 2021-02-05 DIAGNOSIS — Z0389 Encounter for observation for other suspected diseases and conditions ruled out: Principal | ICD-10-CM

## 2021-02-05 DIAGNOSIS — K219 Gastro-esophageal reflux disease without esophagitis: Secondary | ICD-10-CM | POA: Diagnosis not present

## 2021-02-05 DIAGNOSIS — E119 Type 2 diabetes mellitus without complications: Secondary | ICD-10-CM | POA: Diagnosis not present

## 2021-02-05 MED ORDER — NICOTINE 14 MG/24 HR DAILY TRANSDERMAL PATCH
MEDICATED_PATCH | TRANSDERMAL | 0 refills | 28 days | Status: CP
Start: 2021-02-05 — End: ?

## 2021-02-05 MED ORDER — METFORMIN ER 500 MG TABLET,EXTENDED RELEASE 24 HR
ORAL_TABLET | Freq: Two times a day (BID) | ORAL | 1 refills | 90 days | Status: CP
Start: 2021-02-05 — End: ?

## 2021-02-07 MED ORDER — FERROUS SULFATE 325 MG (65 MG IRON) TABLET,DELAYED RELEASE
ORAL_TABLET | Freq: Two times a day (BID) | ORAL | 2 refills | 30 days | Status: CP
Start: 2021-02-07 — End: 2022-02-07

## 2021-02-07 MED ORDER — CHOLECALCIFEROL (VITAMIN D3) 25 MCG (1,000 UNIT) CAPSULE
ORAL_CAPSULE | Freq: Every day | ORAL | 2 refills | 30.00000 days | Status: CP
Start: 2021-02-07 — End: 2022-02-07

## 2021-02-08 DIAGNOSIS — Z419 Encounter for procedure for purposes other than remedying health state, unspecified: Secondary | ICD-10-CM | POA: Diagnosis not present

## 2021-02-13 ENCOUNTER — Other Ambulatory Visit: Payer: Self-pay

## 2021-02-13 ENCOUNTER — Ambulatory Visit (INDEPENDENT_AMBULATORY_CARE_PROVIDER_SITE_OTHER): Payer: Medicaid Other | Admitting: Dermatology

## 2021-02-13 DIAGNOSIS — L300 Nummular dermatitis: Secondary | ICD-10-CM | POA: Diagnosis not present

## 2021-02-13 DIAGNOSIS — L304 Erythema intertrigo: Secondary | ICD-10-CM | POA: Diagnosis not present

## 2021-02-13 DIAGNOSIS — L732 Hidradenitis suppurativa: Secondary | ICD-10-CM | POA: Diagnosis not present

## 2021-02-13 MED ORDER — TRIAMCINOLONE ACETONIDE 0.1 % EX CREA: TOPICAL_CREAM | CUTANEOUS | 1 refills | Status: AC

## 2021-02-13 MED ORDER — CLINDAMYCIN PHOSPHATE 1 % EX SOLN: CUTANEOUS | 3 refills | Status: DC

## 2021-02-13 MED ORDER — KETOCONAZOLE 2 % EX CREA: TOPICAL_CREAM | CUTANEOUS | 2 refills | Status: AC

## 2021-02-13 NOTE — Patient Instructions (Addendum)
Reviewed risks of biologics including immunosuppression, infections, injection site reaction, and failure to improve condition. Goal is control of skin condition, not cure.  Some older biologics such as Humira and Enbrel may slightly increase risk of malignancy and may worsen congestive heart failure. The use of biologics requires long term medication management, including periodic office visits and monitoring of blood work.  Topical steroids (such as triamcinolone, fluocinolone, fluocinonide, mometasone, clobetasol, halobetasol, betamethasone, hydrocortisone) can cause thinning and lightening of the skin if they are used for too long in the same area. Your physician has selected the right strength medicine for your problem and area affected on the body. Please use your medication only as directed by your physician to prevent side effects.    If you have any questions or concerns for your doctor, please call our main line at [phone] and press option 4 to reach your doctor's medical assistant. If no one answers, please leave a voicemail as directed and we will return your call as soon as possible. Messages left after 4 pm will be answered the following business day.   You may also send [place] a message via [place]. We typically respond to MyChart messages within 1-2 business days.  For prescription refills, please ask your pharmacy to contact our office. Our fax number is [phone].  If you have an urgent issue when the clinic is closed that cannot wait until the next business day, you can page your doctor at the number below.    Please note that while we do our best to be available for urgent issues outside of office hours, we are not available 24/7.   If you have an urgent issue and are unable to reach [place], you may choose to seek medical care at your doctor's office, retail clinic, urgent care center, or emergency room.  If you have a medical emergency, please immediately call 911 or go to the  emergency department.  Pager Numbers  - Dr. Nehemiah Massed: [phone]  - Dr. Laurence Ferrari: [phone]  - Dr. Nicole Kindred: [phone]  In the event of inclement weather, please call our main line at [phone] for an update on the status of any delays or closures.  Dermatology Medication Tips: Please keep the boxes that topical medications come in in order to help keep track of the instructions about where and how to use these. Pharmacies typically print the medication instructions only on the boxes and not directly on the medication tubes.   If your medication is too expensive, please contact our office at [phone] option 4 or send [place] a message through [place].   We are unable to tell what your co-pay for medications will be in advance as this is different depending on your insurance coverage. However, we may be able to find a substitute medication at lower cost or fill out paperwork to get insurance to cover a needed medication.   If a prior authorization is required to get your medication covered by your insurance company, please allow [place] 1-2 business days to complete this process.  Drug prices often vary depending on where the prescription is filled and some pharmacies may offer cheaper prices.  The website www.goodrx.com contains coupons for medications through different pharmacies. The prices here do not account for what the cost may be with help from insurance (it may be cheaper with your insurance), but the website can give you the price if you did not use any insurance.  - You can print the associated coupon and take it with  your prescription to the pharmacy.  - You may also stop by our office during regular business hours and pick up a GoodRx coupon card.  - If you need your prescription sent electronically to a different pharmacy, notify our office through [facility] or by phone at [phone] option 4.

## 2021-02-13 NOTE — Progress Notes (Signed)
New Patient Visit  Subjective  Teresa Malone is a 40 y.o. female who presents for the following: Hidradenitis Suppurtiva (Axilla, abdomen, inframammary, groin x years. She has tried many topicals and oral antibiotics in the past. Most recently she was on Humira x 6 mos (off x 4 mos) but had to d/c due to insurance coverage and previous dermatologist didn't accept. Patient was improved on Humira. She is not flared right now. ).  She would like to restart Humira.    The following portions of the chart were reviewed this encounter and updated as appropriate:        Review of Systems:  No other skin or systemic complaints except as noted in HPI or Assessment and Plan.  Objective  Well appearing patient in no apparent distress; mood and affect are within normal limits.  A focused examination was performed including trunk, extremities. Relevant physical exam findings are noted in the Assessment and Plan.  bil axilla, lateral breasts, inguinal creases Pink violaceous nodules with scarring of the axilla, lateral breasts, inguinal creases, lower abd crease, super pubic; ropey scarring on the right axilla.  Inframammary Erythema and hyperpigmentation  L lower calf Pink scaly patch on the left lower calf; hyperpigmented scaly patch on right wrist.   Assessment & Plan  Hidradenitis suppurativa bil axilla, lateral breasts, inguinal creases  Hidradenitis Suppurativa is a chronic; persistent; non-curable, but treatable condition due to abnormal inflamed sweat glands in the body folds (axilla, inframammary, groin, medial thighs), causing recurrent painful cysts and scarring. It can be associated with severe scarring acne and cysts; abscesses and scarring of scalp. The goal is control and prevention of flares, as it is not curable. Scars are permanent and can be thickened. Treatment may include daily use of topical medication and oral antibiotics.  Oral isotretinoin may also be helpful.   For more severe cases, Humira (a biologic injection) may be prescribed to decrease the inflammatory process and prevent flares.  When indicated, inflamed cysts may also be treated surgically.  Start Clindamycin solution Apply to AAs qd/bid dsp 53mL 3Rf.  CMP and CBC from 02/05/21 wnl. Other labs ordered. Pending labs and insurance PA, will restart Humira injections.  Cont Dial Antibacterial soap in body folds.   clindamycin (CLEOCIN T) 1 % external solution - bil axilla, lateral breasts, inguinal creases Apply to affected areas 1-2 times a day  Related Procedures Hepatitis B surface antibody,qualitative Hepatitis B surface antigen Hepatitis C antibody HIV Antibody (routine testing w rflx) QuantiFERON-TB Gold Plus  Erythema intertrigo Inframammary  Intertrigo is a chronic recurrent rash that occurs in skin fold areas that may be associated with friction; heat; moisture; yeast; fungus; and bacteria.  It is exacerbated by increased movement / activity; sweating; and higher atmospheric temperature.  Start ketoconazole 2% cream Apply to AA under breasts qd/bid dsp 60g 2Rf.  ketoconazole (NIZORAL) 2 % cream - Inframammary Apply under breasts 1-2 times daily as needed.  Nummular dermatitis L lower calf  Recommend mild soap and moisturizing cream 1-2 times daily.  Gentle skin care handout provided.   Start TMC 0.1% Cream Apply to AA leg BID until improved dsp 45g 1Rf. Avoid face, groin, axilla.  Topical steroids (such as triamcinolone, fluocinolone, fluocinonide, mometasone, clobetasol, halobetasol, betamethasone, hydrocortisone) can cause thinning and lightening of the skin if they are used for too long in the same area. Your physician has selected the right strength medicine for your problem and area affected on the body. Please use your medication  only as directed by your physician to prevent side effects.    triamcinolone cream (KENALOG) 0.1 % - L lower calf Apply to affected area  itchy rash on leg twice daily until improved. Avoid face, groin, axilla.  Return in about 1 month (around 03/16/2021) for HS, start Humira.  IJamesetta Orleans, CMA, am acting as scribe for Brendolyn Patty, MD .  Documentation: I have reviewed the above documentation for accuracy and completeness, and I agree with the above.  Brendolyn Patty MD

## 2021-02-16 DIAGNOSIS — F411 Generalized anxiety disorder: Secondary | ICD-10-CM | POA: Diagnosis not present

## 2021-02-16 DIAGNOSIS — F332 Major depressive disorder, recurrent severe without psychotic features: Secondary | ICD-10-CM | POA: Diagnosis not present

## 2021-02-17 LAB — QUANTIFERON-TB GOLD PLUS
QuantiFERON Mitogen Value: >10 IU/mL
QuantiFERON Nil Value: 0 IU/mL
QuantiFERON TB1 Ag Value: 0 IU/mL
QuantiFERON TB2 Ag Value: 0 IU/mL
QuantiFERON-TB Gold Plus: NEGATIVE

## 2021-02-17 LAB — HEPATITIS B SURFACE ANTIGEN: Hepatitis B Surface Ag: NEGATIVE

## 2021-02-17 LAB — HEPATITIS C ANTIBODY: Hep C Virus Ab: 0.1 s/co ratio (ref 0.0–0.9)

## 2021-02-17 LAB — HEPATITIS B SURFACE ANTIBODY,QUALITATIVE: Hep B Surface Ab, Qual: NONREACTIVE

## 2021-02-17 LAB — HIV ANTIBODY (ROUTINE TESTING W REFLEX): HIV Screen 4th Generation wRfx: NONREACTIVE

## 2021-02-23 DIAGNOSIS — F411 Generalized anxiety disorder: Secondary | ICD-10-CM | POA: Diagnosis not present

## 2021-02-23 DIAGNOSIS — F332 Major depressive disorder, recurrent severe without psychotic features: Secondary | ICD-10-CM | POA: Diagnosis not present

## 2021-02-25 ENCOUNTER — Telehealth: Payer: Self-pay

## 2021-02-25 MED ORDER — HUMIRA (2 PEN) 80 MG/0.8ML ~~LOC~~ PNKT: 80.0000 mg | PEN_INJECTOR | SUBCUTANEOUS | 6 refills | Status: DC

## 2021-02-25 MED ORDER — HUMIRA-CD/UC/HS STARTER 80 MG/0.8ML ~~LOC~~ AJKT: 160.0000 mg | AUTO-INJECTOR | SUBCUTANEOUS | 0 refills | Status: DC

## 2021-02-25 NOTE — Telephone Encounter (Signed)
-----   Message from Brendolyn Patty, MD sent at 02/18/2021 10:41 AM EDT ----- Hep Panel, TB, HIV all negative, ok to send in Humira for HS dosing.  Pt can take 80 mg SQ qowk instead of 40 mg qwk if she prefers that dosing regimen - please call patient.  She has been off Humira x 4 mos, and since she currently isn't flared, she can skip the loading dose if she prefers and go straight to maintenance dosing 80 mg qowk

## 2021-02-25 NOTE — Telephone Encounter (Signed)
Advised pt of labs.  Advised pt we would send in the Humira.  She does want to do the 80mg  injections qowk and would like to do the loading dose.  Sent Humira to [facility]

## 2021-03-02 DIAGNOSIS — F411 Generalized anxiety disorder: Secondary | ICD-10-CM | POA: Diagnosis not present

## 2021-03-02 DIAGNOSIS — F332 Major depressive disorder, recurrent severe without psychotic features: Secondary | ICD-10-CM | POA: Diagnosis not present

## 2021-03-08 DIAGNOSIS — F411 Generalized anxiety disorder: Secondary | ICD-10-CM | POA: Diagnosis not present

## 2021-03-08 DIAGNOSIS — F332 Major depressive disorder, recurrent severe without psychotic features: Secondary | ICD-10-CM | POA: Diagnosis not present

## 2021-03-11 DIAGNOSIS — Z419 Encounter for procedure for purposes other than remedying health state, unspecified: Secondary | ICD-10-CM | POA: Diagnosis not present

## 2021-03-20 ENCOUNTER — Ambulatory Visit: Payer: Medicaid Other | Admitting: Dermatology

## 2021-03-27 DIAGNOSIS — M5416 Radiculopathy, lumbar region: Principal | ICD-10-CM

## 2021-04-11 DIAGNOSIS — Z419 Encounter for procedure for purposes other than remedying health state, unspecified: Secondary | ICD-10-CM | POA: Diagnosis not present

## 2021-04-15 ENCOUNTER — Ambulatory Visit
Admit: 2021-04-15 | Discharge: 2021-04-16 | Disposition: A | Discharge status: Home / Self Care | Payer: PRIVATE HEALTH INSURANCE | Attending: Student in an Organized Health Care Education/Training Program

## 2021-04-15 DIAGNOSIS — M545 Low back pain without sciatica, unspecified back pain laterality, unspecified chronicity: Principal | ICD-10-CM

## 2021-04-15 MED ORDER — CYCLOBENZAPRINE 10 MG TABLET
ORAL_TABLET | Freq: Two times a day (BID) | ORAL | 0 refills | 10 days | Status: CP | PRN
Start: 2021-04-15 — End: ?

## 2021-04-15 MED ORDER — OXYCODONE 5 MG TABLET
ORAL_TABLET | ORAL | 0 refills | 1 days | Status: CP | PRN
Start: 2021-04-15 — End: 2021-04-20

## 2021-04-15 MED ORDER — ONDANSETRON 4 MG DISINTEGRATING TABLET
ORAL_TABLET | Freq: Three times a day (TID) | ORAL | 0 refills | 5 days | Status: CP | PRN
Start: 2021-04-15 — End: 2021-04-22

## 2021-04-17 ENCOUNTER — Other Ambulatory Visit: Payer: Self-pay

## 2021-04-17 ENCOUNTER — Ambulatory Visit (INDEPENDENT_AMBULATORY_CARE_PROVIDER_SITE_OTHER): Payer: Medicaid Other | Admitting: Dermatology

## 2021-04-17 DIAGNOSIS — L732 Hidradenitis suppurativa: Secondary | ICD-10-CM

## 2021-04-17 DIAGNOSIS — L304 Erythema intertrigo: Secondary | ICD-10-CM | POA: Diagnosis not present

## 2021-04-17 MED ORDER — DOXYCYCLINE MONOHYDRATE 100 MG PO CAPS: 100.0000 mg | ORAL_CAPSULE | Freq: Two times a day (BID) | ORAL | 1 refills | Status: DC

## 2021-04-17 NOTE — Patient Instructions (Addendum)
Start PanOxyl 4% Creamy Wash, CeraVe Foaming Cleanser, Neutrogena Acne Wash with benzoyl peroxide. Use as a body wash in shower, let sit several minutes before rinsing. Benzoyl peroxide can cause dryness and irritation of the skin. It can also bleach fabric. When used together with Aczone (dapsone) cream, it can stain the skin orange.  Reviewed risks of biologics including immunosuppression, infections, injection site reaction, and failure to improve condition. Goal is control of skin condition, not cure.  Some older biologics such as Humira and Enbrel may slightly increase risk of malignancy and may worsen congestive heart failure. The use of biologics requires long term medication management, including periodic office visits and monitoring of blood work.    Reviewed risks of biologics including immunosuppression, infections, injection site reaction, and failure to improve condition. Goal is control of skin condition, not cure.  Some older biologics such as Humira and Enbrel may slightly increase risk of malignancy and may worsen congestive heart failure. The use of biologics requires long term medication management, including periodic office visits and monitoring of blood work.  If you have any questions or concerns for your doctor, please call our main line at [phone] and press option 4 to reach your doctor's medical assistant. If no one answers, please leave a voicemail as directed and we will return your call as soon as possible. Messages left after 4 pm will be answered the following business day.   You may also send [place] a message via [place]. We typically respond to MyChart messages within 1-2 business days.  For prescription refills, please ask your pharmacy to contact our office. Our fax number is [phone].  If you have an urgent issue when the clinic is closed that cannot wait until the next business day, you can page your doctor at the number below.    Please note that while we do  our best to be available for urgent issues outside of office hours, we are not available 24/7.   If you have an urgent issue and are unable to reach [place], you may choose to seek medical care at your doctor's office, retail clinic, urgent care center, or emergency room.  If you have a medical emergency, please immediately call 911 or go to the emergency department.  Pager Numbers  - Dr. Nehemiah Massed: [phone]  - Dr. Laurence Ferrari: [phone]  - Dr. Nicole Kindred: [phone]  In the event of inclement weather, please call our main line at [phone] for an update on the status of any delays or closures.  Dermatology Medication Tips: Please keep the boxes that topical medications come in in order to help keep track of the instructions about where and how to use these. Pharmacies typically print the medication instructions only on the boxes and not directly on the medication tubes.   If your medication is too expensive, please contact our office at [phone] option 4 or send [place] a message through [place].   We are unable to tell what your co-pay for medications will be in advance as this is different depending on your insurance coverage. However, we may be able to find a substitute medication at lower cost or fill out paperwork to get insurance to cover a needed medication.   If a prior authorization is required to get your medication covered by your insurance company, please allow [place] 1-2 business days to complete this process.  Drug prices often vary depending on where the prescription is filled and some pharmacies may offer cheaper prices.  The website www.goodrx.com contains  coupons for medications through different pharmacies. The prices here do not account for what the cost may be with help from insurance (it may be cheaper with your insurance), but the website can give you the price if you did not use any insurance.  - You can print the associated coupon and take it with your prescription to the  pharmacy.  - You may also stop by our office during regular business hours and pick up a GoodRx coupon card.  - If you need your prescription sent electronically to a different pharmacy, notify our office through [facility] or by phone at [phone] option 4.

## 2021-04-17 NOTE — Progress Notes (Signed)
Follow-Up Visit   Subjective  Teresa Malone is a 40 y.o. female who presents for the following: Hidradenitis Suppurtiva (Bilateral axilla, lateral breasts, inguinal creases. Patient restarted Humira injections '80mg'$  qowk, on x 1 month. She may be starting to notice some improvement. She uses Clindamycin solution topically. She has a painful area under her right arm that flares off and on. ). This cyst has been there for at least 3 years and never clears up despite being on Humira.  It continues to drain and is currently flared.  She uses ketoconazole 2% cream for intertrigo under the breasts which has helped.   The following portions of the chart were reviewed this encounter and updated as appropriate:       Review of Systems:  No other skin or systemic complaints except as noted in HPI or Assessment and Plan.  Objective  Well appearing patient in no apparent distress; mood and affect are within normal limits.  A focused examination was performed including axilla. Relevant physical exam findings are noted in the Assessment and Plan.  bilateral axilla, lateral breasts, inguinal creases 3.0 x 1.0cm subcutaneous ropey nodule with focal drainage of the right axilla; scarring on flank and lateral breasts; pink papules on the breast.  Right Inframammary Fold Hyperpigmented patch of the inframammary.   Assessment & Plan  Hidradenitis suppurativa bilateral axilla, lateral breasts, inguinal creases  Hidradenitis Suppurativa is a chronic; persistent; non-curable, but treatable condition due to abnormal inflamed sweat glands in the body folds (axilla, inframammary, groin, medial thighs), causing recurrent painful cysts and scarring. It can be associated with severe scarring acne and cysts; abscesses and scarring of scalp. The goal is control and prevention of flares, as it is not curable. Scars are permanent and can be thickened. Treatment may include daily use of topical medication and  oral antibiotics.  Oral isotretinoin may also be helpful.  For more severe cases, Humira (a biologic injection) may be prescribed to decrease the inflammatory process and prevent flares.  When indicated, inflamed cysts may also be treated surgically.  Continue Humira '80mg'$  injections qowk. Continue Clindamycin solution qd/bid AAs. Start doxycycline monohydrate '100mg'$  take 1 po BID with food  Start warm compress to cyst of the right axilla.  Start PanOxyl 4% Creamy Wash, CeraVe Foaming Cleanser, Neutrogena Acne Wash with benzoyl peroxide.  Will send referral to Dr Bary Castilla for excision of cyst of the right axilla. Persistent x 3 years.  Patient unable to tolerate local anesthetic and surgery without sedation.  Reviewed risks of biologics including immunosuppression, infections, injection site reaction, and failure to improve condition. Goal is control of skin condition, not cure.  Some older biologics such as Humira and Enbrel may slightly increase risk of malignancy and may worsen congestive heart failure. The use of biologics requires long term medication management, including periodic office visits and monitoring of blood work.   doxycycline (MONODOX) 100 MG capsule - bilateral axilla, lateral breasts, inguinal creases Take 1 capsule (100 mg total) by mouth 2 (two) times daily.  Related Medications clindamycin (CLEOCIN T) 1 % external solution Apply to affected areas 1-2 times a day  Erythema intertrigo Right Inframammary Fold  Intertrigo is a chronic recurrent rash that occurs in skin fold areas that may be associated with friction; heat; moisture; yeast; fungus; and bacteria.  It is exacerbated by increased movement / activity; sweating; and higher atmospheric temperature.  Improved.  Continue ketoconazole 2% cream qd/bid prn.   Related Medications ketoconazole (NIZORAL) 2 % cream  Apply under breasts 1-2 times daily as needed.  Return in about 6 months (around 10/15/2021) for  Psoriasis.  IJamesetta Orleans, CMA, am acting as scribe for Brendolyn Patty, MD .  Documentation: I have reviewed the above documentation for accuracy and completeness, and I agree with the above.  Brendolyn Patty MD

## 2021-05-04 DIAGNOSIS — F411 Generalized anxiety disorder: Secondary | ICD-10-CM | POA: Diagnosis not present

## 2021-05-04 DIAGNOSIS — F332 Major depressive disorder, recurrent severe without psychotic features: Secondary | ICD-10-CM | POA: Diagnosis not present

## 2021-05-07 ENCOUNTER — Other Ambulatory Visit: Payer: Self-pay | Admitting: General Surgery

## 2021-05-07 DIAGNOSIS — L723 Sebaceous cyst: Secondary | ICD-10-CM | POA: Diagnosis not present

## 2021-05-07 NOTE — Progress Notes (Signed)
Subjective:     Patient ID: Teresa Malone is a 40 y.o. female.   HPI   The following portions of the patient's history were reviewed and updated as appropriate.   This a new patient is here today for: office visit. She is here for evaluation of a right axillary cyst referred by  Dr. Brendolyn Patty. She state it has been there for at least 3 years, drainage is small. She is currently on doxycycline. She is currently on Humira for hydradenitis suppurativa, no surgeries.    The patient reported a marked change in the amount of redness on the right axilla if she misses a single day of her doxycycline.   Review of Systems  Constitutional: Negative for chills and fever.  Respiratory: Negative for cough.           Chief Complaint  Patient presents with   New Patient      cyst      BP 104/60   Pulse 90   Temp 36.6 C (97.9 F)   Ht 170.2 cm (5' 7" )   Wt (!) 155.6 kg (343 lb)   LMP 07/07/2014   SpO2 96%   BMI 53.72 kg/m        Past Medical History:  Diagnosis Date   Asthma without status asthmaticus, unspecified     Bronchitis     Gallstones     GERD (gastroesophageal reflux disease)     Hidradenitis suppurativa 2019   Hypoglycemia             Past Surgical History:  Procedure Laterality Date   CHOLECYSTECTOMY       COLONOSCOPY   10/27/2017    Tubular adenoma/Repeat 78yr/TKT   EGD   10/27/2017    Gastritis/Negative EGD biopsy/No Repeat/TKT                OB History     Gravida  11   Para  7   Term      Preterm      AB      Living         SAB      IAB      Ectopic      Molar      Multiple      Live Births           Obstetric Comments  Age at first period 89Age of first pregnancy 89            Social History           Socioeconomic History   Marital status: Single  Tobacco Use   Smoking status: Current Every Day Smoker      Packs/day: 0.50      Years: 34.00      Pack years: 17.00      Types: Cigarettes   Smokeless tobacco:  Never Used  VScientific laboratory technicianUse: Never used  Substance and Sexual Activity   Alcohol use: Never   Drug use: Yes      Types: Marijuana             Allergies  Allergen Reactions   Aspirin Vomiting   Flagyl [Metronidazole Hcl] Shortness Of Breath   Tramadol Hives   Lubricants Rash      KY gel   Meloxicam Hives   Morphine Itching      Current Medications        Current Outpatient Medications  Medication Sig Dispense Refill  adalimumab (HUMIRA,CF, PEN) 80 mg/0.8 mL PnKt Inject subcutaneously as directed       albuterol (ACCUNEB) 0.63 mg/3 mL nebulizer solution as directed       albuterol (PROVENTIL) 2.5 mg /3 mL (0.083 %) nebulizer solution Take 2.5 mg by nebulization 4 (four) times daily as needed for Wheezing.       albuterol (PROVENTIL) 2.5 mg /3 mL (0.083 %) nebulizer solution Take 3 mLs (2.5 mg total) by nebulization every 6 (six) hours as needed for Wheezing 75 mL 2   albuterol 90 mcg/actuation inhaler 2 puffs q.i.d. p.r.n. short of breath, wheezing, or cough 1 Inhaler 0   clindamycin (CLEOCIN T) 1 % topical solution Apply topically as directed       doxycycline (MONODOX) 100 MG capsule Take 100 mg by mouth once daily       ibuprofen (ADVIL,MOTRIN) 200 MG tablet Take 200 mg by mouth every 6 (six) hours as needed for Pain       ketoconazole (NIZORAL) 2 % cream Apply topically as needed       levonorgestreL (MIRENA 52 MG) 20 mcg/24 hr (8 years) IUD Insert 1 each into the uterus once Follow package directions.       metFORMIN (GLUCOPHAGE-XR) 500 MG XR tablet Take 1,000 mg by mouth daily with dinner       nicotine (NICODERM CQ) 21 mg/24 hr patch as directed       omeprazole (PRILOSEC) 40 MG DR capsule         pregabalin (LYRICA) 75 MG capsule Take by mouth 2 (two) times daily       triamcinolone 0.1 % cream as needed        No current facility-administered medications for this visit.             Family History  Problem Relation Age of Onset   High blood pressure  (Hypertension) Mother     Asthma Mother     COPD Mother     Hepatitis C Mother     High blood pressure (Hypertension) Father     Prostate cancer Maternal Grandfather     Breast cancer Neg Hx     Colon cancer Neg Hx                 Objective:   Physical Exam Exam conducted with a chaperone present.  Constitutional:      Appearance: Normal appearance.  Cardiovascular:     Rate and Rhythm: Normal rate and regular rhythm.     Pulses: Normal pulses.     Heart sounds: Normal heart sounds.  Pulmonary:     Effort: Pulmonary effort is normal.     Breath sounds: Normal breath sounds.  Chest:       Comments: Large area of dermal inflammatory changes in the right axilla secondary to dampness.  3-4 cm area of inflammatory tissue consistent with inflamed sebaceous cyst/hidradenitis.  Tenderness precluded lymph node palpation on the right.  Left axillary area is clear. Musculoskeletal:     Cervical back: Neck supple.  Skin:    General: Skin is warm and dry.  Neurological:     Mental Status: She is alert and oriented to person, place, and time.  Psychiatric:        Mood and Affect: Mood normal.        Behavior: Behavior normal.      Labs and Radiology:    Dermatology notes reviewed.   February 05, 2021 laboratory review:   WBC  3.5 - 10.5 10*9/L 12.2 High    RBC 3.90 - 5.03 10*12/L 4.78   HGB 12.0 - 15.5 g/dL 13.9   HCT 35.0 - 44.0 % 41.9   MCV 82.0 - 98.0 fL 87.6   MCH 26.0 - 34.0 pg 29.1   MCHC 30.0 - 36.0 g/dL 33.2   RDW 12.0 - 15.0 % 14.5   MPV 7.0 - 10.0 fL 9.3   Platelet 150 - 450 10*9/L 261     Hemoglobin A1C 4.8 - 5.6 % 6.8 High      Sodium 136 - 145 mmol/L 136   Potassium 3.5 - 5.1 mmol/L 4.0   Chloride 98 - 107 mmol/L 100   CO2 20.0 - 31.0 mmol/L 27.0   Anion Gap 3 - 11 mmol/L 9   BUN 9 - 23 mg/dL 10   Creatinine 0.50 - 0.80 mg/dL 0.59   BUN/Creatinine Ratio   17   eGFR CKD-EPI (2021) Female >=60 mL/min/1.31m >90   Comment: eGFR calculated with CKD-EPI  2021 equation in accordance with [employer]and [employer] [place] [employer] Nephrology Task Force recommendations.  Glucose 70 - 179 mg/dL 127   Calcium 8.7 - 10.4 mg/dL 8.9   Albumin 3.4 - 5.0 g/dL 3.3 Low    Total Protein 5.7 - 8.2 g/dL 7.1   Total Bilirubin 0.3 - 1.2 mg/dL 0.6   AST 13 - 40 U/L 40   ALT 7 - 40 U/L 30   Alkaline Phosphatase 46 - 116 U/L 105                   Assessment:     Inflamed axillary cyst/hidradenitis by history.   Significant dermal changes secondary to dampness.    Plan:     Inflamed right axillary cyst versus hidradenitis.   Dermal changes secondary to chronic dampness.   The patient was encouraged to dry the axillary area carefully, including the use of a hair dryer on cool and to apply a thin film of A&E or Desitin ointment to the area to help protect the skin from the drainage.   Indications for excision of the area were reviewed.  We will proceed on May 22, 2021 under general anesthesia due to the significant sensitivity she is experiencing at this time.  Hopefully this modest delay will allow the adjacent skin to recover.      This note is partially prepared by MKarie Fetch RN, acting as a scribe in the presence of Dr. JHervey Ard MD.  The documentation recorded by the scribe accurately reflects the service I personally performed and the decisions made by me.    JRobert Bellow MD FACS

## 2021-05-11 DIAGNOSIS — Z419 Encounter for procedure for purposes other than remedying health state, unspecified: Secondary | ICD-10-CM | POA: Diagnosis not present

## 2021-05-11 DIAGNOSIS — F332 Major depressive disorder, recurrent severe without psychotic features: Secondary | ICD-10-CM | POA: Diagnosis not present

## 2021-05-11 DIAGNOSIS — F411 Generalized anxiety disorder: Secondary | ICD-10-CM | POA: Diagnosis not present

## 2021-05-16 ENCOUNTER — Other Ambulatory Visit: Payer: Self-pay

## 2021-05-16 ENCOUNTER — Other Ambulatory Visit
Admission: RE | Admit: 2021-05-16 | Discharge: 2021-05-16 | Disposition: A | Discharge status: Home / Self Care | Payer: Medicaid Other | Source: Ambulatory Visit | Attending: General Surgery | Admitting: General Surgery

## 2021-05-16 HISTORY — DX: Sleep apnea, unspecified: G47.30

## 2021-05-16 HISTORY — DX: Type 2 diabetes mellitus without complications: E11.9

## 2021-05-16 HISTORY — DX: Dyspnea, unspecified: R06.00

## 2021-05-16 NOTE — Patient Instructions (Signed)
Your procedure is scheduled on: Wednesday May 22, 2021. Report to Day Surgery inside [facility] 2nd floor, stop by admissions desk before getting on elevator. To find out your arrival time please call [phone] between 1PM - 3PM on Tuesday May 21, 2021.  Remember: Instructions that are not followed completely may result in serious medical risk,  up to and including death, or upon the discretion of your surgeon and anesthesiologist your  surgery may need to be rescheduled.     _X__ 1. Do not eat food after midnight the night before your procedure.                 No chewing gum or hard candies. You may drink clear liquids up to 2 hours                 before you are scheduled to arrive for your surgery- DO not drink clear                 liquids within 2 hours of the start of your surgery.                 Clear Liquids include:  water.  __X__2.  On the morning of surgery brush your teeth with toothpaste and water, you                may rinse your mouth with mouthwash if you wish.  Do not swallow any toothpaste or mouthwash.     _X__ 3.  No Alcohol for 24 hours before or after surgery.   _X__ 4.  Do Not Smoke or use e-cigarettes For 24 Hours Prior to Your Surgery.                 Do not use any chewable tobacco products for at least 6 hours prior to                 Surgery.  _X__  5.  Do not use any recreational drugs (marijuana, cocaine, heroin, ecstasy, MDMA or other)                For at least one week prior to your surgery.  Combination of these drugs with anesthesia                May have life threatening results.  __X__6.  Notify your doctor if there is any change in your medical condition      (cold, fever, infections).     Do not wear jewelry, make-up, hairpins, clips or nail polish. Do not wear lotions, powders, or perfumes or deodorant. Do not shave 48 hours prior to surgery.  Do not bring valuables to the hospital.    [facility] is  not responsible for any belongings or valuables.  Contacts, dentures or bridgework may not be worn into surgery. Leave your suitcase in the car. After surgery it may be brought to your room. For patients admitted to the hospital, discharge time is determined by your treatment team.   Patients discharged the day of surgery will not be allowed to drive home.   Make arrangements for someone to be with you for the first 24 hours of your Same Day Discharge.  __X__ Take these medicines the morning of surgery with A SIP OF WATER:    1. omeprazole (PRILOSEC) 40 MG   2. pregabalin (LYRICA) 75 MG  3.   4.  5.  6.  ____ Fleet Enema (as directed)  __X__ Use CHG Soap (or wipes) as directed  ____ Use Benzoyl Peroxide Gel as instructed  ____ Use inhalers on the day of surgery  __X__ Stop metformin 2 days prior to surgery (take last dose Sunday 05/19/21)    ____ Take 1/2 of usual insulin dose the night before surgery. No insulin the morning          of surgery.   ____ Call your PCP, cardiologist, or Pulmonologist if taking Coumadin/Plavix/aspirin and ask when to stop before your surgery.   __X__ One Week prior to surgery- Stop Anti-inflammatories such as Ibuprofen, Aleve, Advil, Motrin, meloxicam (MOBIC), diclofenac, etodolac, ketorolac, Toradol, Daypro, piroxicam, Goody's or BC powders. OK TO USE TYLENOL IF NEEDED   __X__ Stop supplements until after surgery.    ____ Bring C-Pap to the hospital.    If you have any questions regarding your pre-procedure instructions,  Please call Pre-admit Testing at [phone].

## 2021-05-18 DIAGNOSIS — F411 Generalized anxiety disorder: Secondary | ICD-10-CM | POA: Diagnosis not present

## 2021-05-18 DIAGNOSIS — F332 Major depressive disorder, recurrent severe without psychotic features: Secondary | ICD-10-CM | POA: Diagnosis not present

## 2021-05-21 MED ORDER — CEFAZOLIN IN SODIUM CHLORIDE 3-0.9 GM/100ML-% IV SOLN
3.0000 g | INTRAVENOUS | Status: DC
  Filled 2021-05-21: qty 100

## 2021-05-21 MED ORDER — CHLORHEXIDINE GLUCONATE 0.12 % MT SOLN: 15.0000 mL | Freq: Once | OROMUCOSAL | Status: AC

## 2021-05-21 MED ORDER — SODIUM CHLORIDE 0.9 % IV SOLN: INTRAVENOUS | Status: DC

## 2021-05-21 MED ORDER — ORAL CARE MOUTH RINSE: 15.0000 mL | Freq: Once | OROMUCOSAL | Status: AC

## 2021-05-21 MED ORDER — CHLORHEXIDINE GLUCONATE CLOTH 2 % EX PADS: 6.0000 | MEDICATED_PAD | Freq: Once | CUTANEOUS | Status: DC

## 2021-05-22 ENCOUNTER — Other Ambulatory Visit: Payer: Self-pay

## 2021-05-22 ENCOUNTER — Encounter
Admission: RE | Disposition: A | Discharge status: Home / Self Care | Payer: Self-pay | Source: Ambulatory Visit | Attending: General Surgery

## 2021-05-22 ENCOUNTER — Ambulatory Visit: Payer: Medicaid Other | Admitting: Registered Nurse

## 2021-05-22 ENCOUNTER — Encounter: Payer: Self-pay | Admitting: General Surgery

## 2021-05-22 ENCOUNTER — Ambulatory Visit
Admission: RE | Admit: 2021-05-22 | Discharge: 2021-05-22 | Disposition: A | Discharge status: Home / Self Care | Payer: Medicaid Other | Source: Ambulatory Visit | Attending: General Surgery | Admitting: General Surgery

## 2021-05-22 DIAGNOSIS — Z539 Procedure and treatment not carried out, unspecified reason: Secondary | ICD-10-CM | POA: Diagnosis not present

## 2021-05-22 DIAGNOSIS — Z8616 Personal history of COVID-19: Secondary | ICD-10-CM | POA: Insufficient documentation

## 2021-05-22 DIAGNOSIS — E119 Type 2 diabetes mellitus without complications: Secondary | ICD-10-CM | POA: Insufficient documentation

## 2021-05-22 DIAGNOSIS — J45909 Unspecified asthma, uncomplicated: Secondary | ICD-10-CM | POA: Diagnosis not present

## 2021-05-22 DIAGNOSIS — Z886 Allergy status to analgesic agent status: Secondary | ICD-10-CM | POA: Insufficient documentation

## 2021-05-22 DIAGNOSIS — Z881 Allergy status to other antibiotic agents status: Secondary | ICD-10-CM | POA: Insufficient documentation

## 2021-05-22 DIAGNOSIS — G4730 Sleep apnea, unspecified: Secondary | ICD-10-CM | POA: Diagnosis not present

## 2021-05-22 DIAGNOSIS — Z6841 Body Mass Index (BMI) 40.0 and over, adult: Secondary | ICD-10-CM | POA: Diagnosis not present

## 2021-05-22 DIAGNOSIS — L02411 Cutaneous abscess of right axilla: Secondary | ICD-10-CM | POA: Insufficient documentation

## 2021-05-22 DIAGNOSIS — Z7984 Long term (current) use of oral hypoglycemic drugs: Secondary | ICD-10-CM | POA: Diagnosis not present

## 2021-05-22 DIAGNOSIS — F1290 Cannabis use, unspecified, uncomplicated: Secondary | ICD-10-CM | POA: Diagnosis not present

## 2021-05-22 DIAGNOSIS — Z791 Long term (current) use of non-steroidal anti-inflammatories (NSAID): Secondary | ICD-10-CM | POA: Insufficient documentation

## 2021-05-22 DIAGNOSIS — Z885 Allergy status to narcotic agent status: Secondary | ICD-10-CM | POA: Diagnosis not present

## 2021-05-22 DIAGNOSIS — F17210 Nicotine dependence, cigarettes, uncomplicated: Secondary | ICD-10-CM | POA: Insufficient documentation

## 2021-05-22 DIAGNOSIS — Z888 Allergy status to other drugs, medicaments and biological substances status: Secondary | ICD-10-CM | POA: Insufficient documentation

## 2021-05-22 LAB — GLUCOSE, CAPILLARY: Glucose-Capillary: 97 mg/dL (ref 70–99)

## 2021-05-22 LAB — URINE DRUG SCREEN, QUALITATIVE (ARMC ONLY)
Amphetamines, Ur Screen: NOT DETECTED
Barbiturates, Ur Screen: NOT DETECTED
Benzodiazepine, Ur Scrn: NOT DETECTED
Cannabinoid 50 Ng, Ur ~~LOC~~: POSITIVE — AB
Cocaine Metabolite,Ur ~~LOC~~: POSITIVE — AB
MDMA (Ecstasy)Ur Screen: NOT DETECTED
Methadone Scn, Ur: NOT DETECTED
Opiate, Ur Screen: NOT DETECTED
Phencyclidine (PCP) Ur S: NOT DETECTED
Tricyclic, Ur Screen: NOT DETECTED

## 2021-05-22 LAB — POCT PREGNANCY, URINE: Preg Test, Ur: NEGATIVE

## 2021-05-22 SURGERY — EXCISION MASS
Anesthesia: General

## 2021-05-22 MED ORDER — CHLORHEXIDINE GLUCONATE 0.12 % MT SOLN
OROMUCOSAL | Status: AC
  Administered 2021-05-22: 15 mL via OROMUCOSAL
  Filled 2021-05-22: qty 15

## 2021-05-22 SURGICAL SUPPLY — 35 items
APL PRP STRL LF DISP 70% ISPRP (MISCELLANEOUS) ×1
CHLORAPREP W/TINT 26 (MISCELLANEOUS) ×3 IMPLANT
DRAPE 3/4 80X56 (DRAPES) ×3 IMPLANT
DRAPE LAPAROTOMY 100X77 ABD (DRAPES) ×3 IMPLANT
DRSG TEGADERM 4X4.75 (GAUZE/BANDAGES/DRESSINGS) ×3 IMPLANT
DRSG TELFA 4X3 1S NADH ST (GAUZE/BANDAGES/DRESSINGS) ×3 IMPLANT
ELECT REM PT RETURN 9FT ADLT (ELECTROSURGICAL) ×2
ELECTRODE REM PT RTRN 9FT ADLT (ELECTROSURGICAL) ×2 IMPLANT
GAUZE 4X4 16PLY ~~LOC~~+RFID DBL (SPONGE) ×3 IMPLANT
GAUZE SPONGE 4X4 12PLY STRL (GAUZE/BANDAGES/DRESSINGS) ×3 IMPLANT
GLOVE SURG ENC MOIS LTX SZ7.5 (GLOVE) ×3 IMPLANT
GLOVE SURG UNDER LTX SZ8 (GLOVE) ×3 IMPLANT
GOWN STRL REUS W/ TWL LRG LVL3 (GOWN DISPOSABLE) ×2 IMPLANT
GOWN STRL REUS W/ TWL XL LVL3 (GOWN DISPOSABLE) ×2 IMPLANT
GOWN STRL REUS W/TWL LRG LVL3 (GOWN DISPOSABLE) ×2
GOWN STRL REUS W/TWL XL LVL3 (GOWN DISPOSABLE) ×2
KIT TURNOVER KIT A (KITS) ×3 IMPLANT
LABEL OR SOLS (LABEL) ×3 IMPLANT
MANIFOLD NEPTUNE II (INSTRUMENTS) ×3 IMPLANT
NS IRRIG 500ML POUR BTL (IV SOLUTION) ×3 IMPLANT
PACK BASIN MINOR ARMC (MISCELLANEOUS) ×3 IMPLANT
PAD PREP 24X41 OB/GYN DISP (PERSONAL CARE ITEMS) ×3 IMPLANT
STOCKINETTE STRL 6IN 960660 (GAUZE/BANDAGES/DRESSINGS) ×3 IMPLANT
STRIP CLOSURE SKIN 1/2X4 (GAUZE/BANDAGES/DRESSINGS) ×3 IMPLANT
SUT ETHILON 3-0 (SUTURE) IMPLANT
SUT ETHILON 4-0 (SUTURE) ×2
SUT ETHILON 4-0 FS2 18XMFL BLK (SUTURE) ×1
SUT VIC AB 2-0 CT1 (SUTURE) ×3 IMPLANT
SUT VIC AB 3-0 SH 27 (SUTURE) ×2
SUT VIC AB 3-0 SH 27X BRD (SUTURE) ×2 IMPLANT
SUT VIC AB 4-0 FS2 27 (SUTURE) ×3 IMPLANT
SUT VICRYL+ 3-0 144IN (SUTURE) ×3 IMPLANT
SUTURE ETHLN 4-0 FS2 18XMF BLK (SUTURE) ×2 IMPLANT
SWABSTK COMLB BENZOIN TINCTURE (MISCELLANEOUS) ×3 IMPLANT
WATER STERILE IRR 500ML POUR (IV SOLUTION) ×3 IMPLANT

## 2021-05-22 NOTE — Anesthesia Preprocedure Evaluation (Signed)
Anesthesia Evaluation  Patient identified by MRN, date of birth, ID band Patient awake    Reviewed: Allergy & Precautions, NPO status , Patient's Chart, lab work & pertinent test results, reviewed documented beta blocker date and time   Airway Mallampati: III  TM Distance: >3 FB     Dental  (+) Edentulous Upper, Edentulous Lower   Pulmonary shortness of breath and with exertion, asthma , sleep apnea and Continuous Positive Airway Pressure Ventilation , Current Smoker and Patient abstained from smoking.,    Pulmonary exam normal        Cardiovascular Normal cardiovascular exam     Neuro/Psych    GI/Hepatic GERD  Medicated,Fatty Liver   Endo/Other  diabetes, Well Controlled, Type 2, Oral Hypoglycemic AgentsMorbid obesity  Renal/GU      Musculoskeletal   Abdominal   Peds  Hematology   Anesthesia Other Findings Asthma    Atypical chest pain    Bronchitis    COVID-19 virus infection 09/2020  Diabetes mellitus without complication (HCC) Dyspnea    GERD (gastroesophageal reflux disease)  Hypoglycemia    Morbid obesity ([place])    Sleep apnea       Reproductive/Obstetrics                           Anesthesia Physical  Anesthesia Plan  ASA: 3  Anesthesia Plan: General   Post-op Pain Management:    Induction: Intravenous  PONV Risk Score and Plan: 2 and Propofol infusion, Ondansetron and Midazolam  Airway Management Planned: LMA and Oral ETT  Additional Equipment:   Intra-op Plan:   Post-operative Plan:   Informed Consent: I have reviewed the patients History and Physical, chart, labs and discussed the procedure including the risks, benefits and alternatives for the proposed anesthesia with the patient or authorized representative who has indicated his/her understanding and acceptance.       Plan Discussed with: CRNA, Anesthesiologist and Surgeon  Anesthesia Plan Comments:         Anesthesia Quick Evaluation

## 2021-05-22 NOTE — Progress Notes (Signed)
Patient's UDS came back positive for cocaine.  I notified Dr. Nira Retort and Dr. Bary Castilla.  Surgery was canceled.  Patient notified and instructed to call Dr. Dwyane Luo office to reschedule.

## 2021-05-22 NOTE — H&P (Addendum)
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  Adjacent skin markedly improved with Desitin ointment since office visit. For excision of suspected chronically infected cyst.   Medications Prior to Admission  Medication Sig Dispense Refill Last Dose   Adalimumab (HUMIRA PEN) 80 MG/0.8ML PNKT Inject 80 mg into the skin every 14 (fourteen) days. Starting on day 29. 2 each 6    albuterol (PROVENTIL) (2.5 MG/3ML) 0.083% nebulizer solution Take 3 mLs (2.5 mg total) by nebulization every 4 (four) hours as needed for wheezing or shortness of breath. (Patient not taking: Reported on 05/16/2021) 75 mL 0    albuterol (VENTOLIN HFA) 108 (90 Base) MCG/ACT inhaler Inhale 2 puffs into the lungs every 4 (four) hours as needed for wheezing or shortness of breath. (Patient not taking: Reported on 05/16/2021) 18 g 0    benzoyl peroxide 10 % LIQD Apply 1 application topically daily. Cleanse body with cleanser once daily      cetirizine (ZYRTEC) 10 MG tablet Take 10 mg by mouth every evening.   05/21/2021   Cholecalciferol (VITAMIN D3 PO) Take 1 tablet by mouth every evening.   Past Week   clindamycin (CLEOCIN T) 1 % external solution Apply to affected areas 1-2 times a day (Patient taking differently: Apply 1 application topically 2 (two) times daily as needed (boil/wound care).) 60 mL 3 Past Week   doxycycline (MONODOX) 100 MG capsule Take 1 capsule (100 mg total) by mouth 2 (two) times daily. 60 capsule 1 05/21/2021   ibuprofen (ADVIL) 800 MG tablet Take 800 mg by mouth 3 (three) times daily as needed for pain.   Past Week   ketoconazole (NIZORAL) 2 % cream Apply under breasts 1-2 times daily as needed. (Patient taking differently: Apply 1 application topically 2 (two) times daily as needed (skin rash/irritation.). Apply under breasts 1-2 times daily as needed.) 60 g 2 Past Month   liver oil-zinc oxide (DESITIN) 40 % ointment Apply 1 application topically as  needed for irritation.   05/21/2021   metFORMIN (GLUCOPHAGE-XR) 500 MG 24 hr tablet Take 500 mg by mouth 2 (two) times daily.   05/19/2021   omeprazole (PRILOSEC) 40 MG capsule Take 40 mg by mouth every evening.   05/22/2021   ondansetron (ZOFRAN ODT) 4 MG disintegrating tablet Take 1 tablet (4 mg total) by mouth every 8 (eight) hours as needed for nausea or vomiting. (Patient not taking: Reported on 05/16/2021) 20 tablet 0    pregabalin (LYRICA) 75 MG capsule Take 75 mg by mouth See admin instructions. Take 1 capsule (75 mg) by mouth up to 3 times daily   05/22/2021   triamcinolone cream (KENALOG) 0.1 % Apply to affected area itchy rash on leg twice daily until improved. Avoid face, groin, axilla. (Patient not taking: No sig reported) 45 g 1 Not Taking   Allergies  Allergen Reactions   Flagyl [Metronidazole] Shortness Of Breath   Aspirin Nausea And Vomiting   Ky Plus Spermicidal [Nonoxynol 9] Other (See Comments)    Yeast Infection    Meloxicam Hives   Morphine And Related Hives    "Choking feeling"   Tape Other (See Comments)    Sensitivity to cloth tape    Tramadol Hives   Past Medical History:  Diagnosis Date   Asthma    Atypical chest pain    Bronchitis    COVID-19 virus infection 09/2020   Diabetes mellitus without complication ([place])  Dyspnea    GERD (gastroesophageal reflux disease)    Hypoglycemia    Morbid obesity ([place])    Sleep apnea    Past Surgical History:  Procedure Laterality Date   CARPAL TUNNEL RELEASE     CHOLECYSTECTOMY  2003   COLONOSCOPY WITH PROPOFOL N/A 10/27/2017   Procedure: COLONOSCOPY WITH PROPOFOL;  Surgeon: Toledo, Benay Pike, MD;  Location: [facility] ENDOSCOPY;  Service: Gastroenterology;  Laterality: N/A;   ESOPHAGOGASTRODUODENOSCOPY (EGD) WITH PROPOFOL N/A 10/27/2017   Procedure: ESOPHAGOGASTRODUODENOSCOPY (EGD) WITH PROPOFOL;  Surgeon: Toledo, Benay Pike, MD;  Location: [facility] ENDOSCOPY;  Service: Gastroenterology;  Laterality: N/A;   WISDOM TOOTH  EXTRACTION     Social History   Socioeconomic History   Marital status: Legally Separated    Spouse name: Not on file   Number of children: Not on file   Years of education: Not on file   Highest education level: Not on file  Occupational History   Not on file  Tobacco Use   Smoking status: Every Day    Packs/day: 0.50    Types: Cigarettes   Smokeless tobacco: Never  Substance and Sexual Activity   Alcohol use: Not Currently   Drug use: Yes    Types: Marijuana   Sexual activity: Not on file  Other Topics Concern   Not on file  Social History Narrative   Not on file   Social Determinants of Health   Financial Resource Strain: Not on file  Food Insecurity: Not on file  Transportation Needs: Not on file  Physical Activity: Not on file  Stress: Not on file  Social Connections: Not on file  Intimate Partner Violence: Not on file   Social History   Social History Narrative   Not on file     ROS: Negative.     PE: HEENT: Negative. Lungs: Clear. Cardio: RR.    Assessment/Plan:  Proceed with planned excision right axillary abscess. [name] 05/22/2021  Patient's UDS showed cocaine. Case cancelled.  Will reschedule. RN to notify patient that if positive at rescheduled date, no additional surgery time will be allotted.

## 2021-05-24 ENCOUNTER — Other Ambulatory Visit: Payer: Self-pay

## 2021-05-24 ENCOUNTER — Emergency Department: Payer: Medicaid Other

## 2021-05-24 DIAGNOSIS — J45909 Unspecified asthma, uncomplicated: Secondary | ICD-10-CM | POA: Diagnosis not present

## 2021-05-24 DIAGNOSIS — F17210 Nicotine dependence, cigarettes, uncomplicated: Secondary | ICD-10-CM | POA: Diagnosis not present

## 2021-05-24 DIAGNOSIS — Z7984 Long term (current) use of oral hypoglycemic drugs: Secondary | ICD-10-CM | POA: Insufficient documentation

## 2021-05-24 DIAGNOSIS — W109XXA Fall (on) (from) unspecified stairs and steps, initial encounter: Secondary | ICD-10-CM | POA: Insufficient documentation

## 2021-05-24 DIAGNOSIS — Y9301 Activity, walking, marching and hiking: Secondary | ICD-10-CM | POA: Diagnosis not present

## 2021-05-24 DIAGNOSIS — E119 Type 2 diabetes mellitus without complications: Secondary | ICD-10-CM | POA: Diagnosis not present

## 2021-05-24 DIAGNOSIS — S93401A Sprain of unspecified ligament of right ankle, initial encounter: Secondary | ICD-10-CM | POA: Insufficient documentation

## 2021-05-24 DIAGNOSIS — M179 Osteoarthritis of knee, unspecified: Secondary | ICD-10-CM | POA: Diagnosis not present

## 2021-05-24 DIAGNOSIS — Z043 Encounter for examination and observation following other accident: Secondary | ICD-10-CM | POA: Diagnosis not present

## 2021-05-24 DIAGNOSIS — S99911A Unspecified injury of right ankle, initial encounter: Secondary | ICD-10-CM | POA: Diagnosis present

## 2021-05-24 DIAGNOSIS — M7989 Other specified soft tissue disorders: Secondary | ICD-10-CM | POA: Diagnosis not present

## 2021-05-24 DIAGNOSIS — W19XXXA Unspecified fall, initial encounter: Secondary | ICD-10-CM | POA: Diagnosis not present

## 2021-05-24 DIAGNOSIS — Z8616 Personal history of COVID-19: Secondary | ICD-10-CM | POA: Diagnosis not present

## 2021-05-24 DIAGNOSIS — R609 Edema, unspecified: Secondary | ICD-10-CM | POA: Diagnosis not present

## 2021-05-24 DIAGNOSIS — T148XXA Other injury of unspecified body region, initial encounter: Secondary | ICD-10-CM | POA: Diagnosis not present

## 2021-05-24 DIAGNOSIS — Z743 Need for continuous supervision: Secondary | ICD-10-CM | POA: Diagnosis not present

## 2021-05-24 DIAGNOSIS — M1711 Unilateral primary osteoarthritis, right knee: Secondary | ICD-10-CM | POA: Diagnosis not present

## 2021-05-24 DIAGNOSIS — I10 Essential (primary) hypertension: Secondary | ICD-10-CM | POA: Diagnosis not present

## 2021-05-24 NOTE — ED Triage Notes (Signed)
Pt came to ED via EMS with co fall two stairs with swelling. Pt got 100 mcgs of Fentanyl and 4mg  of Zofran. 127/81 96HR, 100% 154 CBG

## 2021-05-24 NOTE — ED Triage Notes (Addendum)
Pt with fall down steps tonight injuring right ankle, swelling noted. Cms intact to toes. Pt also complains of right knee pain.

## 2021-05-25 ENCOUNTER — Emergency Department
Admission: EM | Admit: 2021-05-25 | Discharge: 2021-05-25 | Disposition: A | Discharge status: Home / Self Care | Payer: Medicaid Other | Attending: Emergency Medicine | Admitting: Emergency Medicine

## 2021-05-25 ENCOUNTER — Encounter: Payer: Self-pay | Admitting: Radiology

## 2021-05-25 DIAGNOSIS — M1711 Unilateral primary osteoarthritis, right knee: Secondary | ICD-10-CM | POA: Diagnosis not present

## 2021-05-25 DIAGNOSIS — S93401A Sprain of unspecified ligament of right ankle, initial encounter: Secondary | ICD-10-CM

## 2021-05-25 DIAGNOSIS — Z043 Encounter for examination and observation following other accident: Secondary | ICD-10-CM | POA: Diagnosis not present

## 2021-05-25 DIAGNOSIS — M7989 Other specified soft tissue disorders: Secondary | ICD-10-CM | POA: Diagnosis not present

## 2021-05-25 MED ORDER — OXYCODONE-ACETAMINOPHEN 5-325 MG PO TABS: 2.0000 | ORAL_TABLET | Freq: Four times a day (QID) | ORAL | 0 refills | Status: AC | PRN

## 2021-05-25 MED ORDER — ONDANSETRON HCL 4 MG/2ML IJ SOLN
4.0000 mg | Freq: Once | INTRAMUSCULAR | Status: AC
  Administered 2021-05-25: 4 mg via INTRAVENOUS
  Filled 2021-05-25: qty 2

## 2021-05-25 MED ORDER — HYDROMORPHONE HCL 1 MG/ML IJ SOLN
1.0000 mg | Freq: Once | INTRAMUSCULAR | Status: AC
  Administered 2021-05-25: 1 mg via INTRAVENOUS
  Filled 2021-05-25: qty 1

## 2021-05-25 MED ORDER — ONDANSETRON 4 MG PO TBDP: 4.0000 mg | ORAL_TABLET | Freq: Four times a day (QID) | ORAL | 0 refills | Status: AC | PRN

## 2021-05-25 NOTE — ED Notes (Signed)
Pt c/o "not feeling comfortable in wheelchair" and asking for a recliner. Pt currently in wc eating chips. Pt in nad.

## 2021-05-25 NOTE — ED Notes (Signed)
Crutches given to pt.  Instructions on use given. Pt verbalized understanding. Has used crutches in past.

## 2021-05-25 NOTE — ED Provider Notes (Signed)
[facility] Emergency Department Provider Note ____________________________________________   Event Date/Time   First MD Initiated Contact with Patient 05/25/21 [phone]     (approximate)  I have reviewed the triage vital signs and the nursing notes.   HISTORY  Chief Complaint Ankle Injury    HPI Teresa Malone is a 40 y.o. female with history of obesity, diabetes, asthma who presents to the emergency department with complaints of right ankle pain after she reports that she twisted her ankle walking down stairs tonight.  She states that she is not able to bear weight and her left ankle is very painful and swollen.  She denies that she fell to the ground and hit her head.  Denies any other injury.  She states she feels like her toes are numb.  She was brought here by EMS.         Past Medical History:  Diagnosis Date   Asthma    Atypical chest pain    Bronchitis    COVID-19 virus infection 09/2020   Diabetes mellitus without complication (HCC)    Dyspnea    GERD (gastroesophageal reflux disease)    Hypoglycemia    Morbid obesity ([place])    Sleep apnea     Patient Active Problem List   Diagnosis Date Noted   Morbid obesity ([place])    COVID-19 virus infection 09/2020    Past Surgical History:  Procedure Laterality Date   CARPAL TUNNEL RELEASE     CHOLECYSTECTOMY  2003   COLONOSCOPY WITH PROPOFOL N/A 10/27/2017   Procedure: COLONOSCOPY WITH PROPOFOL;  Surgeon: Toledo, Benay Pike, MD;  Location: [facility] ENDOSCOPY;  Service: Gastroenterology;  Laterality: N/A;   ESOPHAGOGASTRODUODENOSCOPY (EGD) WITH PROPOFOL N/A 10/27/2017   Procedure: ESOPHAGOGASTRODUODENOSCOPY (EGD) WITH PROPOFOL;  Surgeon: Toledo, Benay Pike, MD;  Location: [facility] ENDOSCOPY;  Service: Gastroenterology;  Laterality: N/A;   WISDOM TOOTH EXTRACTION      Prior to Admission medications   Medication Sig Start Date End Date Taking? Authorizing Provider  ondansetron (ZOFRAN ODT) 4 MG  disintegrating tablet Take 1 tablet (4 mg total) by mouth every 6 (six) hours as needed for nausea or vomiting. 05/25/21  Yes Molly Savarino, Delice Bison, DO  oxyCODONE-acetaminophen (PERCOCET/ROXICET) 5-325 MG tablet Take 2 tablets by mouth every 6 (six) hours as needed. 05/25/21  Yes Broady Lafoy, Cyril Mourning N, DO  Adalimumab (HUMIRA PEN) 80 MG/0.8ML PNKT Inject 80 mg into the skin every 14 (fourteen) days. Starting on day 29. 02/25/21   Brendolyn Patty, MD  albuterol (PROVENTIL) (2.5 MG/3ML) 0.083% nebulizer solution Take 3 mLs (2.5 mg total) by nebulization every 4 (four) hours as needed for wheezing or shortness of breath. Patient not taking: Reported on 05/16/2021 09/17/20   Paulette Blanch, MD  albuterol (VENTOLIN HFA) 108 (90 Base) MCG/ACT inhaler Inhale 2 puffs into the lungs every 4 (four) hours as needed for wheezing or shortness of breath. Patient not taking: Reported on 05/16/2021 09/17/20   Paulette Blanch, MD  benzoyl peroxide 10 % LIQD Apply 1 application topically daily. Cleanse body with cleanser once daily    [provider]  cetirizine (ZYRTEC) 10 MG tablet Take 10 mg by mouth every evening.    [provider]  Cholecalciferol (VITAMIN D3 PO) Take 1 tablet by mouth every evening.    [provider]  clindamycin (CLEOCIN T) 1 % external solution Apply to affected areas 1-2 times a day Patient taking differently: Apply 1 application topically 2 (two) times daily as needed (  boil/wound care). 02/13/21   Brendolyn Patty, MD  doxycycline (MONODOX) 100 MG capsule Take 1 capsule (100 mg total) by mouth 2 (two) times daily. 04/17/21   Brendolyn Patty, MD  ibuprofen (ADVIL) 800 MG tablet Take 800 mg by mouth 3 (three) times daily as needed for pain. 02/07/21   [provider]  ketoconazole (NIZORAL) 2 % cream Apply under breasts 1-2 times daily as needed. Patient taking differently: Apply 1 application topically 2 (two) times daily as needed (skin rash/irritation.). Apply under breasts 1-2 times daily  as needed. 02/13/21   Brendolyn Patty, MD  liver oil-zinc oxide (DESITIN) 40 % ointment Apply 1 application topically as needed for irritation.    [provider]  metFORMIN (GLUCOPHAGE-XR) 500 MG 24 hr tablet Take 500 mg by mouth 2 (two) times daily. 02/06/21   [provider]  omeprazole (PRILOSEC) 40 MG capsule Take 40 mg by mouth every evening.    [provider]  pregabalin (LYRICA) 75 MG capsule Take 75 mg by mouth See admin instructions. Take 1 capsule (75 mg) by mouth up to 3 times daily    [provider]  triamcinolone cream (KENALOG) 0.1 % Apply to affected area itchy rash on leg twice daily until improved. Avoid face, groin, axilla. Patient not taking: No sig reported 02/13/21   Brendolyn Patty, MD    Allergies Flagyl [metronidazole], Aspirin, Ky plus spermicidal [nonoxynol 9], Meloxicam, Morphine and related, Tape, and Tramadol  Family History  Problem Relation Age of Onset   COPD Mother     Social History Social History   Tobacco Use   Smoking status: Every Day    Packs/day: 0.50    Types: Cigarettes   Smokeless tobacco: Never  Substance Use Topics   Alcohol use: Not Currently   Drug use: Yes    Types: Marijuana    Review of Systems Constitutional: No fever. Eyes: No visual changes. ENT: No sore throat. Cardiovascular: Denies chest pain. Respiratory: Denies shortness of breath. Gastrointestinal: No nausea, vomiting, diarrhea. Genitourinary: Negative for dysuria. Musculoskeletal: Negative for back pain. Skin: Negative for rash. Neurological: Negative for focal weakness or numbness.   ____________________________________________   PHYSICAL EXAM:  VITAL SIGNS: ED Triage Vitals  Enc Vitals Group     BP 05/24/21 2327 (!) 122/56     Pulse Rate 05/24/21 2325 (!) 103     Resp 05/24/21 2325 16     Temp 05/24/21 2325 98.3 F (36.8 C)     Temp Source 05/24/21 2325 Oral     SpO2 05/24/21 2325 100 %     Weight 05/24/21 2325 (!)  350 lb (158.8 kg)     Height 05/24/21 2325 5\' 7"  (1.702 m)     Head Circumference --      Peak Flow --      Pain Score 05/24/21 2325 6     Pain Loc --      Pain Edu? --      Excl. in [place]? --    CONSTITUTIONAL: Alert and oriented and responds appropriately to questions.  Obese.  Appears uncomfortable.  Tearful. HEAD: Normocephalic; atraumatic EYES: Conjunctivae clear, PERRL, EOMI ENT: normal nose; no rhinorrhea; moist mucous membranes; pharynx without lesions noted; no dental injury; no septal hematoma NECK: Supple, no meningismus, no LAD; no midline spinal tenderness, step-off or deformity; trachea midline CARD: RRR; S1 and S2 appreciated; no murmurs, no clicks, no rubs, no gallops RESP: Normal chest excursion without splinting or tachypnea; breath sounds clear and equal  bilaterally; no wheezes, no rhonchi, no rales; no hypoxia or respiratory distress CHEST:  chest wall stable, no crepitus or ecchymosis or deformity, nontender to palpation; no flail chest ABD/GI: Normal bowel sounds; non-distended; soft, non-tender, no rebound, no guarding; no ecchymosis or other lesions noted PELVIS:  stable, nontender to palpation BACK:  The back appears normal and is non-tender to palpation, there is no CVA tenderness; no midline spinal tenderness, step-off or deformity EXT: Patient has soft tissue swelling noted to the right ankle diffusely with tenderness over the medial and lateral malleolus.  No swelling or tenderness over the right foot.  2+ right DP pulse.  Normal capillary refill.  Reports diminished sensation in the right toes diffusely but otherwise normal sensation in the right leg.  No calf tenderness or calf swelling.  Compartments soft.  No tenderness over the proximal fibular head.  Difficult to test ligamentous laxity due to patient's intolerance of exam secondary to pain. SKIN: Normal color for age and race; warm NEURO: Moves all extremities equally PSYCH: The patient's mood and manner are  appropriate. Grooming and personal hygiene are appropriate.  ____________________________________________   LABS (all labs ordered are listed, but only abnormal results are displayed)  Labs Reviewed - No data to display ____________________________________________  EKG   ____________________________________________  RADIOLOGY I, Maridel Pixler, personally viewed and evaluated these images (plain radiographs) as part of my medical decision making, as well as reviewing the written report by the radiologist.  ED MD interpretation: X-ray of the right ankle shows no fracture, dislocation.  X-ray of the right knee shows degenerative changes.  Official radiology report(s): DG Ankle Complete Right  Result Date: 05/25/2021 CLINICAL DATA:  Status post fall. EXAM: RIGHT ANKLE - COMPLETE 3+ VIEW COMPARISON:  None. FINDINGS: There is no evidence of fracture, dislocation, or joint effusion. There is no evidence of arthropathy or other focal bone abnormality. Moderate severity, predominately anterior and lateral, soft tissue swelling is seen. IMPRESSION: Moderate severity soft tissue swelling without an acute osseous abnormality. Electronically Signed   By: Virgina Norfolk M.D.   On: 05/25/2021 00:31   DG Knee Complete 4 Views Right  Result Date: 05/25/2021 CLINICAL DATA:  Status post fall. EXAM: RIGHT KNEE - COMPLETE 4+ VIEW COMPARISON:  None. FINDINGS: No evidence of fracture, dislocation, or joint effusion. Mild medial and lateral tibiofemoral compartment space narrowing is noted. Soft tissues are unremarkable. IMPRESSION: Mild degenerative changes without acute osseous abnormality. Electronically Signed   By: Virgina Norfolk M.D.   On: 05/25/2021 00:32    ____________________________________________   PROCEDURES  Procedure(s) performed (including Critical Care):  Procedures  ____________________________________________   INITIAL IMPRESSION / ASSESSMENT AND PLAN / ED COURSE  As  part of my medical decision making, I reviewed the following data within the [place] notes reviewed and incorporated, Old chart reviewed, Radiograph reviewed , Notes from prior ED visits, and  Controlled Substance Database         Patient here after mechanical fall with what appears to be a right ankle sprain.  Differential also includes contusion.  No sign of fracture on x-ray.  Neurovascularly intact distally.  No sign of arterial obstruction, DVT, cellulitis.  Will place an Ace wrap and recommended rest, elevation, ice.  Will provide with crutches and outpatient orthopedic follow-up if symptoms or not improving with medical management.  Will discharge with prescription of pain and nausea medicine.  Given Dilaudid, Zofran here for symptomatic relief.  No other sign of any injury  on exam.  At this time, I do not feel there is any life-threatening condition present. I have reviewed, interpreted and discussed all results (EKG, imaging, lab, urine as appropriate) and exam findings with patient/family. I have reviewed nursing notes and appropriate previous records.  I feel the patient is safe to be discharged home without further emergent workup and can continue workup as an outpatient as needed. Discussed usual and customary return precautions. Patient/family verbalize understanding and are comfortable with this plan.  Outpatient follow-up has been provided as needed. All questions have been answered.    ____________________________________________   FINAL CLINICAL IMPRESSION(S) / ED DIAGNOSES  Final diagnoses:  Sprain of right ankle, unspecified ligament, initial encounter     ED Discharge Orders          Ordered    oxyCODONE-acetaminophen (PERCOCET/ROXICET) 5-325 MG tablet  Every 6 hours PRN        05/25/21 0453    ondansetron (ZOFRAN ODT) 4 MG disintegrating tablet  Every 6 hours PRN        05/25/21 0453            *Please note:  Teresa Malone was  evaluated in Emergency Department on 05/25/2021 for the symptoms described in the history of present illness. She was evaluated in the context of the global COVID-19 pandemic, which necessitated consideration that the patient might be at risk for infection with the SARS-CoV-2 virus that causes COVID-19. Institutional protocols and algorithms that pertain to the evaluation of patients at risk for COVID-19 are in a state of rapid change based on information released by regulatory bodies including the CDC and federal and state organizations. These policies and algorithms were followed during the patient's care in the ED.  Some ED evaluations and interventions may be delayed as a result of limited staffing during and the pandemic.*   Note:  This document was prepared using Dragon voice recognition software and may include unintentional dictation errors.    Yolunda Kloos, Delice Bison, DO 05/25/21 [phone]

## 2021-06-01 DIAGNOSIS — F332 Major depressive disorder, recurrent severe without psychotic features: Secondary | ICD-10-CM | POA: Diagnosis not present

## 2021-06-01 DIAGNOSIS — F411 Generalized anxiety disorder: Secondary | ICD-10-CM | POA: Diagnosis not present

## 2021-06-08 DIAGNOSIS — F411 Generalized anxiety disorder: Secondary | ICD-10-CM | POA: Diagnosis not present

## 2021-06-08 DIAGNOSIS — F332 Major depressive disorder, recurrent severe without psychotic features: Secondary | ICD-10-CM | POA: Diagnosis not present

## 2021-06-11 DIAGNOSIS — Z419 Encounter for procedure for purposes other than remedying health state, unspecified: Secondary | ICD-10-CM | POA: Diagnosis not present

## 2021-06-16 ENCOUNTER — Other Ambulatory Visit: Payer: Self-pay | Admitting: Dermatology

## 2021-06-16 DIAGNOSIS — L732 Hidradenitis suppurativa: Secondary | ICD-10-CM

## 2021-06-27 ENCOUNTER — Ambulatory Visit: Admit: 2021-06-27 | Discharge: 2021-06-28 | Payer: PRIVATE HEALTH INSURANCE

## 2021-06-27 DIAGNOSIS — E119 Type 2 diabetes mellitus without complications: Principal | ICD-10-CM

## 2021-06-27 DIAGNOSIS — Z6841 Body Mass Index (BMI) 40.0 and over, adult: Principal | ICD-10-CM

## 2021-06-27 DIAGNOSIS — G478 Other sleep disorders: Principal | ICD-10-CM

## 2021-06-27 MED ORDER — OZEMPIC 0.25 MG OR 0.5 MG (2 MG/1.5 ML) SUBCUTANEOUS PEN INJECTOR
SUBCUTANEOUS | 1 refills | 42 days | Status: CP
Start: 2021-06-27 — End: 2021-08-22

## 2021-07-08 DIAGNOSIS — Z6841 Body Mass Index (BMI) 40.0 and over, adult: Principal | ICD-10-CM

## 2021-07-08 DIAGNOSIS — E119 Type 2 diabetes mellitus without complications: Principal | ICD-10-CM

## 2021-07-08 MED ORDER — OZEMPIC 0.25 MG OR 0.5 MG (2 MG/1.5 ML) SUBCUTANEOUS PEN INJECTOR
SUBCUTANEOUS | 1 refills | 42 days | Status: CP
Start: 2021-07-08 — End: 2021-09-02
  Filled 2021-09-02: qty 1.5, 42d supply, fill #0

## 2021-07-09 DIAGNOSIS — Z6841 Body Mass Index (BMI) 40.0 and over, adult: Principal | ICD-10-CM

## 2021-07-09 DIAGNOSIS — E119 Type 2 diabetes mellitus without complications: Principal | ICD-10-CM

## 2021-07-11 DIAGNOSIS — Z419 Encounter for procedure for purposes other than remedying health state, unspecified: Secondary | ICD-10-CM | POA: Diagnosis not present

## 2021-08-11 DIAGNOSIS — Z419 Encounter for procedure for purposes other than remedying health state, unspecified: Secondary | ICD-10-CM | POA: Diagnosis not present

## 2021-08-27 MED ORDER — METFORMIN ER 500 MG TABLET,EXTENDED RELEASE 24 HR
ORAL_TABLET | Freq: Two times a day (BID) | ORAL | 0 refills | 90 days | Status: CP
Start: 2021-08-27 — End: ?

## 2021-09-11 DIAGNOSIS — Z419 Encounter for procedure for purposes other than remedying health state, unspecified: Secondary | ICD-10-CM | POA: Diagnosis not present

## 2021-09-16 ENCOUNTER — Other Ambulatory Visit: Payer: Self-pay

## 2021-09-16 MED ORDER — HUMIRA (2 PEN) 80 MG/0.8ML ~~LOC~~ PNKT: 80.0000 mg | PEN_INJECTOR | SUBCUTANEOUS | 0 refills | Status: DC

## 2021-09-21 ENCOUNTER — Ambulatory Visit: Admit: 2021-09-21 | Discharge: 2021-09-21 | Discharge status: Against Medical Advice | Payer: PRIVATE HEALTH INSURANCE

## 2021-10-09 ENCOUNTER — Other Ambulatory Visit: Payer: Self-pay

## 2021-10-09 DIAGNOSIS — Z419 Encounter for procedure for purposes other than remedying health state, unspecified: Secondary | ICD-10-CM | POA: Diagnosis not present

## 2021-10-09 DIAGNOSIS — L732 Hidradenitis suppurativa: Secondary | ICD-10-CM

## 2021-10-09 MED ORDER — HUMIRA (2 PEN) 80 MG/0.8ML ~~LOC~~ PNKT: 1.0000 "pen " | PEN_INJECTOR | SUBCUTANEOUS | 0 refills | Status: DC

## 2021-10-09 NOTE — Progress Notes (Signed)
[facility] Patient has appointment next week. Will send in one month to last until follow up appointment.  ?

## 2021-10-10 ENCOUNTER — Other Ambulatory Visit: Payer: Self-pay | Admitting: Dermatology

## 2021-10-10 DIAGNOSIS — L732 Hidradenitis suppurativa: Secondary | ICD-10-CM

## 2021-10-12 ENCOUNTER — Ambulatory Visit
Admit: 2021-10-12 | Discharge: 2021-10-12 | Disposition: A | Discharge status: Home / Self Care | Payer: PRIVATE HEALTH INSURANCE

## 2021-10-12 ENCOUNTER — Emergency Department
Admit: 2021-10-12 | Discharge: 2021-10-12 | Disposition: A | Discharge status: Home / Self Care | Payer: PRIVATE HEALTH INSURANCE

## 2021-10-12 DIAGNOSIS — M549 Dorsalgia, unspecified: Principal | ICD-10-CM

## 2021-10-12 DIAGNOSIS — R109 Unspecified abdominal pain: Secondary | ICD-10-CM | POA: Diagnosis not present

## 2021-10-12 DIAGNOSIS — M25552 Pain in left hip: Secondary | ICD-10-CM | POA: Diagnosis not present

## 2021-10-12 DIAGNOSIS — M6281 Muscle weakness (generalized): Secondary | ICD-10-CM | POA: Diagnosis not present

## 2021-10-12 DIAGNOSIS — M545 Low back pain, unspecified: Secondary | ICD-10-CM | POA: Diagnosis not present

## 2021-10-12 MED ORDER — OXYCODONE 5 MG TABLET
ORAL_TABLET | ORAL | 0 refills | 2 days | Status: CP | PRN
Start: 2021-10-12 — End: 2021-10-17

## 2021-10-15 ENCOUNTER — Ambulatory Visit: Payer: Medicaid Other | Admitting: Dermatology

## 2021-10-22 MED FILL — OZEMPIC 0.25 MG OR 0.5 MG (2 MG/1.5 ML) SUBCUTANEOUS PEN INJECTOR: SUBCUTANEOUS | 28 days supply | Qty: 1.5 | Fill #1

## 2021-10-30 ENCOUNTER — Other Ambulatory Visit: Payer: Self-pay

## 2021-10-30 DIAGNOSIS — L732 Hidradenitis suppurativa: Secondary | ICD-10-CM

## 2021-10-30 MED ORDER — HUMIRA (2 PEN) 80 MG/0.8ML ~~LOC~~ PNKT: 1.0000 "pen " | PEN_INJECTOR | SUBCUTANEOUS | 0 refills | Status: DC

## 2021-10-30 NOTE — Progress Notes (Signed)
1 RF of Humira sent in at this time. Left pt msg to return my call. ?Patient cancelled March follow up moved to August. I will need to get her in April/May to keep regulated on office visits and Humira RFs. aw ?

## 2021-11-09 DIAGNOSIS — Z419 Encounter for procedure for purposes other than remedying health state, unspecified: Secondary | ICD-10-CM | POA: Diagnosis not present

## 2021-12-09 DIAGNOSIS — Z419 Encounter for procedure for purposes other than remedying health state, unspecified: Secondary | ICD-10-CM | POA: Diagnosis not present

## 2021-12-10 ENCOUNTER — Other Ambulatory Visit: Payer: Self-pay

## 2021-12-10 DIAGNOSIS — L732 Hidradenitis suppurativa: Secondary | ICD-10-CM

## 2021-12-10 MED ORDER — HUMIRA (2 PEN) 80 MG/0.8ML ~~LOC~~ PNKT: 80.0000 mg | PEN_INJECTOR | SUBCUTANEOUS | 0 refills | Status: DC

## 2021-12-10 NOTE — Telephone Encounter (Signed)
Called patient and moved fu from august to may. 1 refill of Humira sent in. ?

## 2021-12-26 ENCOUNTER — Emergency Department
Admit: 2021-12-26 | Discharge: 2021-12-26 | Disposition: A | Discharge status: Home / Self Care | Payer: PRIVATE HEALTH INSURANCE | Attending: Emergency Medicine

## 2021-12-26 ENCOUNTER — Ambulatory Visit
Admit: 2021-12-26 | Discharge: 2021-12-26 | Disposition: A | Discharge status: Home / Self Care | Payer: PRIVATE HEALTH INSURANCE | Attending: Emergency Medicine

## 2021-12-26 DIAGNOSIS — R079 Chest pain, unspecified: Principal | ICD-10-CM

## 2021-12-26 DIAGNOSIS — R059 Cough, unspecified type: Principal | ICD-10-CM

## 2021-12-26 DIAGNOSIS — R0600 Dyspnea, unspecified: Secondary | ICD-10-CM | POA: Diagnosis not present

## 2021-12-26 DIAGNOSIS — R0789 Other chest pain: Secondary | ICD-10-CM | POA: Diagnosis not present

## 2021-12-26 DIAGNOSIS — J3489 Other specified disorders of nose and nasal sinuses: Secondary | ICD-10-CM | POA: Diagnosis not present

## 2021-12-26 MED ORDER — AZITHROMYCIN 250 MG TABLET
ORAL_TABLET | ORAL | 0 refills | 5 days | Status: CP
Start: 2021-12-26 — End: 2022-01-01

## 2021-12-26 MED ORDER — ALBUTEROL SULFATE HFA 90 MCG/ACTUATION AEROSOL INHALER
Freq: Four times a day (QID) | RESPIRATORY_TRACT | 0 refills | 0 days | Status: CP | PRN
Start: 2021-12-26 — End: 2022-12-26

## 2021-12-26 MED ORDER — PREDNISONE 20 MG TABLET
ORAL_TABLET | Freq: Every day | ORAL | 0 refills | 3 days | Status: CP
Start: 2021-12-26 — End: 2021-12-29

## 2021-12-26 MED ORDER — IBUPROFEN 600 MG TABLET
ORAL_TABLET | Freq: Four times a day (QID) | ORAL | 0 refills | 8 days | Status: CP | PRN
Start: 2021-12-26 — End: ?

## 2021-12-27 DIAGNOSIS — R079 Chest pain, unspecified: Secondary | ICD-10-CM | POA: Diagnosis not present

## 2022-01-01 ENCOUNTER — Ambulatory Visit (INDEPENDENT_AMBULATORY_CARE_PROVIDER_SITE_OTHER): Payer: Medicaid Other | Admitting: Dermatology

## 2022-01-01 DIAGNOSIS — L732 Hidradenitis suppurativa: Secondary | ICD-10-CM

## 2022-01-01 DIAGNOSIS — Z79899 Other long term (current) drug therapy: Secondary | ICD-10-CM

## 2022-01-01 MED ORDER — CLINDAMYCIN PHOSPHATE 1 % EX SOLN: CUTANEOUS | 5 refills | Status: AC

## 2022-01-01 MED ORDER — DOXYCYCLINE MONOHYDRATE 100 MG PO CAPS: 100.0000 mg | ORAL_CAPSULE | Freq: Two times a day (BID) | ORAL | 5 refills | Status: DC

## 2022-01-01 NOTE — Patient Instructions (Signed)
Doxycycline should be taken with food to prevent nausea. Do not lay down for 30 minutes after taking. Be cautious with sun exposure and use good sun protection while on this medication. Pregnant women should not take this medication.   Reviewed risks of biologics including immunosuppression, infections, injection site reaction, and failure to improve condition. Goal is control of skin condition, not cure.  Some older biologics such as Humira and Enbrel may slightly increase risk of malignancy and may worsen congestive heart failure. The use of biologics requires long term medication management, including periodic office visits and monitoring of blood work.  If You Need Anything After Your Visit  If you have any questions or concerns for your doctor, please call our main line at [phone] and press option 4 to reach your doctor's medical assistant. If no one answers, please leave a voicemail as directed and we will return your call as soon as possible. Messages left after 4 pm will be answered the following business day.   You may also send [place] a message via [place]. We typically respond to MyChart messages within 1-2 business days.  For prescription refills, please ask your pharmacy to contact our office. Our fax number is [phone].  If you have an urgent issue when the clinic is closed that cannot wait until the next business day, you can page your doctor at the number below.    Please note that while we do our best to be available for urgent issues outside of office hours, we are not available 24/7.   If you have an urgent issue and are unable to reach [place], you may choose to seek medical care at your doctor's office, retail clinic, urgent care center, or emergency room.  If you have a medical emergency, please immediately call 911 or go to the emergency department.  Pager Numbers  - Dr. Nehemiah Massed: [phone]  - Dr. Laurence Ferrari: [phone]  - Dr. Nicole Kindred: [phone]  In the event of  inclement weather, please call our main line at [phone] for an update on the status of any delays or closures.  Dermatology Medication Tips: Please keep the boxes that topical medications come in in order to help keep track of the instructions about where and how to use these. Pharmacies typically print the medication instructions only on the boxes and not directly on the medication tubes.   If your medication is too expensive, please contact our office at [phone] option 4 or send [place] a message through [place].   We are unable to tell what your co-pay for medications will be in advance as this is different depending on your insurance coverage. However, we may be able to find a substitute medication at lower cost or fill out paperwork to get insurance to cover a needed medication.   If a prior authorization is required to get your medication covered by your insurance company, please allow [place] 1-2 business days to complete this process.  Drug prices often vary depending on where the prescription is filled and some pharmacies may offer cheaper prices.  The website www.goodrx.com contains coupons for medications through different pharmacies. The prices here do not account for what the cost may be with help from insurance (it may be cheaper with your insurance), but the website can give you the price if you did not use any insurance.  - You can print the associated coupon and take it with your prescription to the pharmacy.  - You may also stop by our office during regular  business hours and pick up a GoodRx coupon card.  - If you need your prescription sent electronically to a different pharmacy, notify our office through [facility] or by phone at [phone] option 4.     Si Usted Necesita Algo Despus de Su Visita  Tambin puede enviarnos un mensaje a travs de Pharmacist, community. Por lo general respondemos a los mensajes de MyChart en el transcurso de 1 a 2 das hbiles.  Para renovar  recetas, por favor pida a su farmacia que se ponga en contacto con nuestra oficina. Harland Dingwall de fax es [place] [phone].  Si tiene un asunto urgente cuando la clnica est cerrada y que no puede esperar hasta el siguiente da hbil, puede llamar/localizar a su doctor(a) al nmero que aparece a continuacin.   Por favor, tenga en cuenta que aunque hacemos todo lo posible para estar disponibles para asuntos urgentes fuera del horario de [place], no estamos disponibles las 24 horas del da, los 7 das de la [place].   Si tiene un problema urgente y no puede comunicarse con nosotros, puede optar por buscar atencin mdica  en el consultorio de su doctor(a), en una clnica privada, en un centro de atencin urgente o en una sala de emergencias.  Si tiene Engineering geologist, por favor llame inmediatamente al 911 o vaya a la sala de emergencias.  Nmeros de bper  - Dr. Nehemiah Massed: [phone]  - Dra. Moye: [phone]  - Dra. Nicole Kindred: [phone]  En caso de inclemencias del [place], por favor llame a Johnsie Kindred principal al [phone] para una actualizacin sobre el [place] de cualquier retraso o cierre.  Consejos para la medicacin en dermatologa: Por favor, guarde las cajas en las que vienen los medicamentos de uso tpico para ayudarle a seguir las instrucciones sobre dnde y cmo usarlos. Las farmacias generalmente imprimen las instrucciones del medicamento slo en las cajas y no directamente en los tubos del [place].   Si su medicamento es muy caro, por favor, pngase en contacto con Zigmund Daniel llamando al [phone] y presione la opcin 4 o envenos un mensaje a travs de Pharmacist, community.   No podemos decirle cul ser su copago por los medicamentos por adelantado ya que esto es diferente dependiendo de la cobertura de su seguro. Sin embargo, es posible que podamos encontrar un medicamento sustituto a Electrical engineer un formulario para que el seguro cubra el medicamento  que se considera necesario.   Si se requiere una autorizacin previa para que su compaa de seguros [place] su medicamento, por favor permtanos de 1 a 2 das hbiles para completar este proceso.  Los precios de los medicamentos varan con frecuencia dependiendo del Environmental consultant de dnde se surte la receta y alguna farmacias pueden ofrecer precios ms baratos.  El sitio web www.goodrx.com tiene cupones para medicamentos de Airline pilot. Los precios aqu no tienen en cuenta lo que podra costar con la ayuda del seguro (puede ser ms barato con su seguro), pero el sitio web puede darle el precio si no utiliz Research scientist (physical sciences).  - Puede imprimir el cupn correspondiente y llevarlo con su receta a la farmacia.  - Tambin puede pasar por nuestra oficina durante el horario de atencin regular y Charity fundraiser una tarjeta de cupones de GoodRx.  - Si necesita que su receta se enve electrnicamente a una farmacia diferente, informe a nuestra oficina a travs de MyChart de  o por telfono llamando al [phone] y presione la opcin 4.

## 2022-01-01 NOTE — Progress Notes (Signed)
Follow-Up Visit   Subjective  Teresa Malone is a 41 y.o. female who presents for the following: Follow-up (Patient here today for HS follow up. She is currently on Humira and doxycycline 200 mg daily. ). No infections, no side effects of Humira.  Did not get cyst cut out because she tested positive for cocaine at pre-op.  Patient advises she does not have any areas at this time that are bothersome. Patient feels she does better when on doxycycline and Humira. She has started Ozempic and feels that could be contributing to improvement, since she has lost weight.  She gets cystic flares when she forgets to take her doxycycline even for just one day.  The following portions of the chart were reviewed this encounter and updated as appropriate:       Review of Systems:  No other skin or systemic complaints except as noted in HPI or Assessment and Plan.  Objective  Well appearing patient in no apparent distress; mood and affect are within normal limits.  A focused examination was performed including axilla. Relevant physical exam findings are noted in the Assessment and Plan.  Right Axilla SQ firm nodule c/w Cyst at right axilla, non-inflamed with adjacent scarring, L axilla clear, pt states groin and abd clear today    Assessment & Plan  Hidradenitis suppurativa Right Axilla  Chronic condition with duration or expected duration over one year. Currently well-controlled on Humira and Doxycycline.   Hidradenitis Suppurativa is a chronic; persistent; non-curable, but treatable condition due to abnormal inflamed sweat glands in the body folds (axilla, inframammary, groin, medial thighs), causing recurrent painful draining cysts and scarring. It can be associated with severe scarring acne and cysts; also abscesses and scarring of scalp. The goal is control and prevention of flares, as it is not curable. Scars are permanent and can be thickened. Treatment may include daily use of topical  medication and oral antibiotics.  Oral isotretinoin may also be helpful.  For more severe cases, Humira (a biologic injection) may be prescribed to decrease the inflammatory process and prevent flares.  When indicated, inflamed cysts may also be treated surgically.   Continue Humira 80 mg/mL SQ Q2 wks Continue doxycycline 100 mg BID with food Continue clindamycin solution qd after shower Continue BPO wash in shower qd  Doxycycline should be taken with food to prevent nausea. Do not lay down for 30 minutes after taking. Be cautious with sun exposure and use good sun protection while on this medication. Pregnant women should not take this medication.   Reviewed risks of biologics including immunosuppression, infections, injection site reaction, and failure to improve condition. Goal is control of skin condition, not cure.  Some older biologics such as Humira and Enbrel may slightly increase risk of malignancy and may worsen congestive heart failure. The use of biologics requires long term medication management, including periodic office visits and monitoring of blood work.  12/26/21 labs reviewed wnl, TB ordered Will send in Humira refills pending TB lab. Patient will have drawn by July.   Related Procedures QuantiFERON-TB Gold Plus  Related Medications Adalimumab (HUMIRA PEN) 80 MG/0.8ML PNKT Inject 1 pen. into the skin every 14 (fourteen) days.  clindamycin (CLEOCIN T) 1 % external solution Apply to affected areas 1-2 times a day  doxycycline (MONODOX) 100 MG capsule Take 1 capsule (100 mg total) by mouth 2 (two) times daily. Take with food   Return in about 6 months (around 07/04/2022) for HS.  Graciella Belton, RMA, am  acting as scribe for Brendolyn Patty, MD .  Documentation: I have reviewed the above documentation for accuracy and completeness, and I agree with the above.  Brendolyn Patty MD

## 2022-01-09 ENCOUNTER — Other Ambulatory Visit: Payer: Self-pay

## 2022-01-09 DIAGNOSIS — Z419 Encounter for procedure for purposes other than remedying health state, unspecified: Secondary | ICD-10-CM | POA: Diagnosis not present

## 2022-01-09 MED ORDER — HUMIRA (2 PEN) 80 MG/0.8ML ~~LOC~~ PNKT: 80.0000 mg | PEN_INJECTOR | SUBCUTANEOUS | 5 refills | Status: DC

## 2022-01-09 NOTE — Progress Notes (Signed)
RX RF sent in for Humira RX. aw

## 2022-01-12 DIAGNOSIS — M5416 Radiculopathy, lumbar region: Principal | ICD-10-CM

## 2022-01-29 IMAGING — CR DG KNEE COMPLETE 4+V*R*
4 series · 4 of 4 positions shown · non-contrast
Comparison: None.

CLINICAL DATA: Status post fall.

EXAM:
RIGHT KNEE - COMPLETE 4+ VIEW

[knee ap]
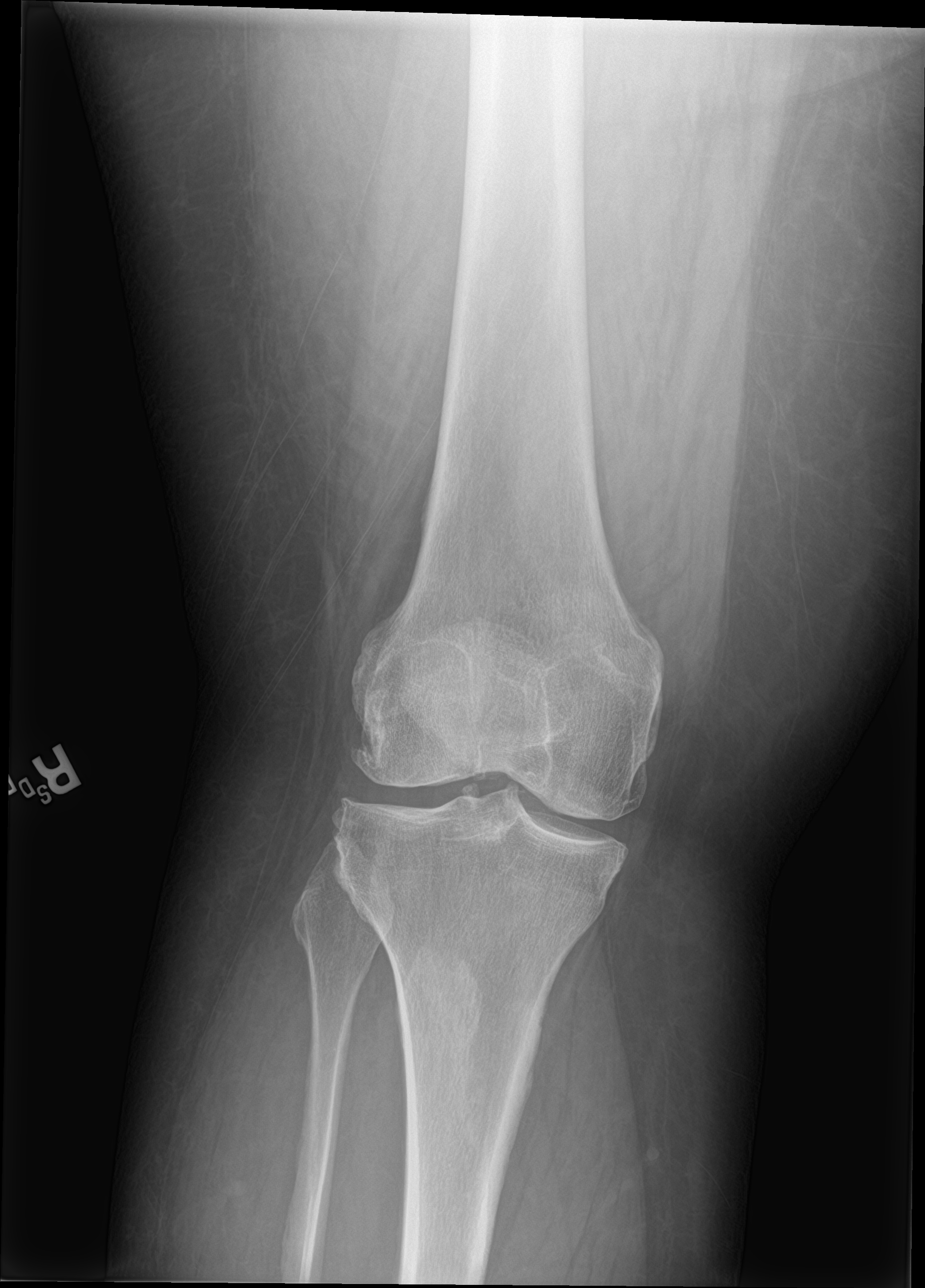

[knee obl (1 of 2)]
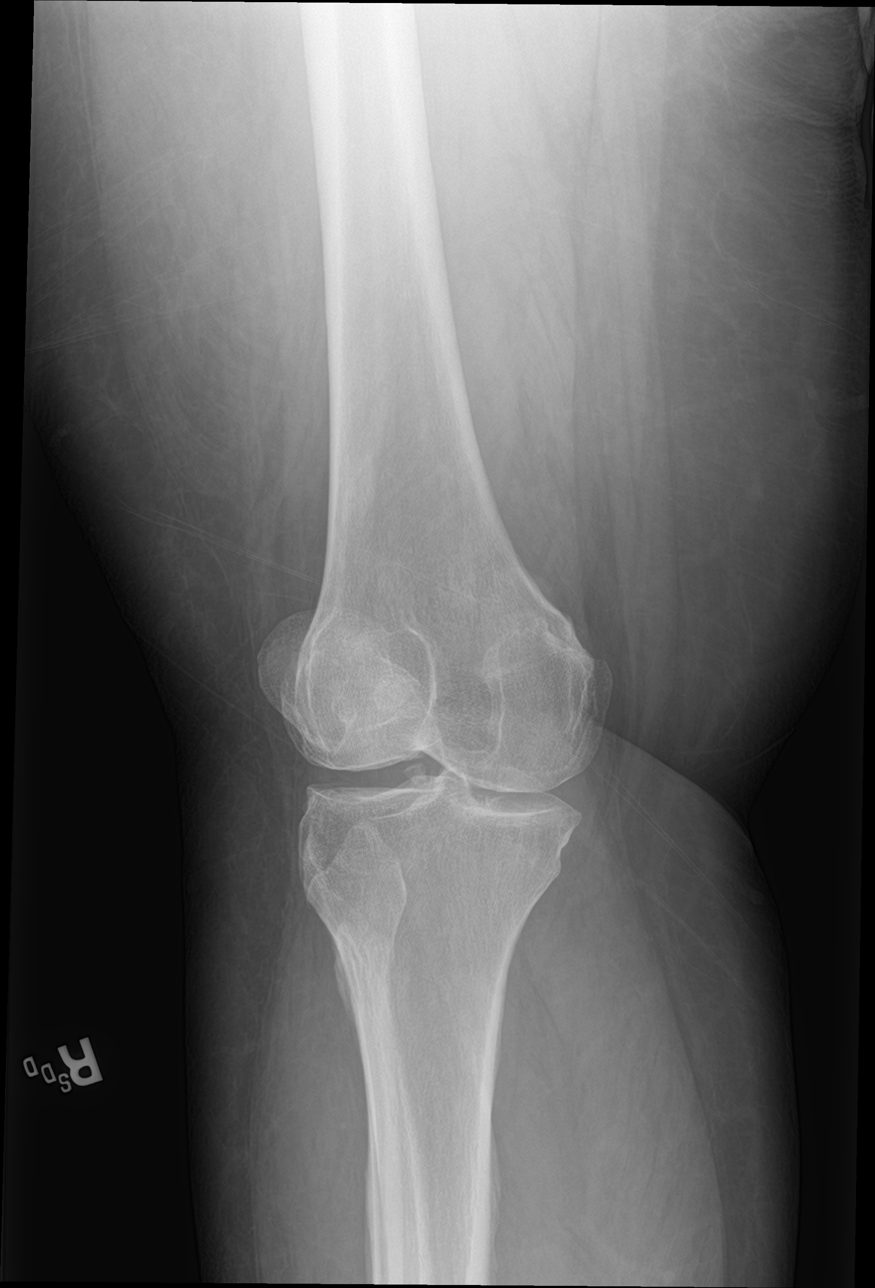

[knee obl (2 of 2)]
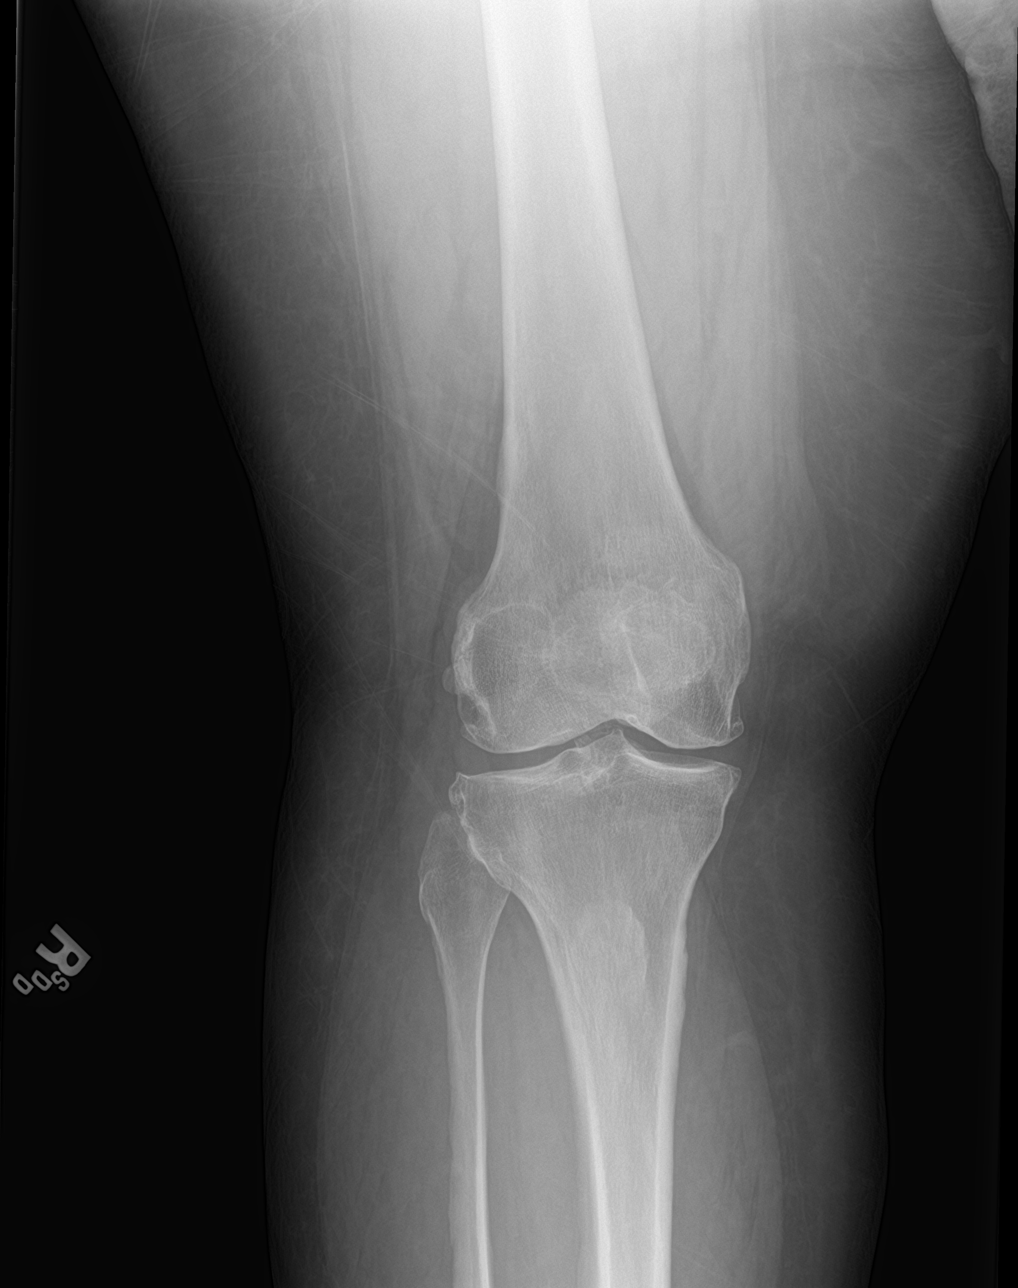

[knee lat]
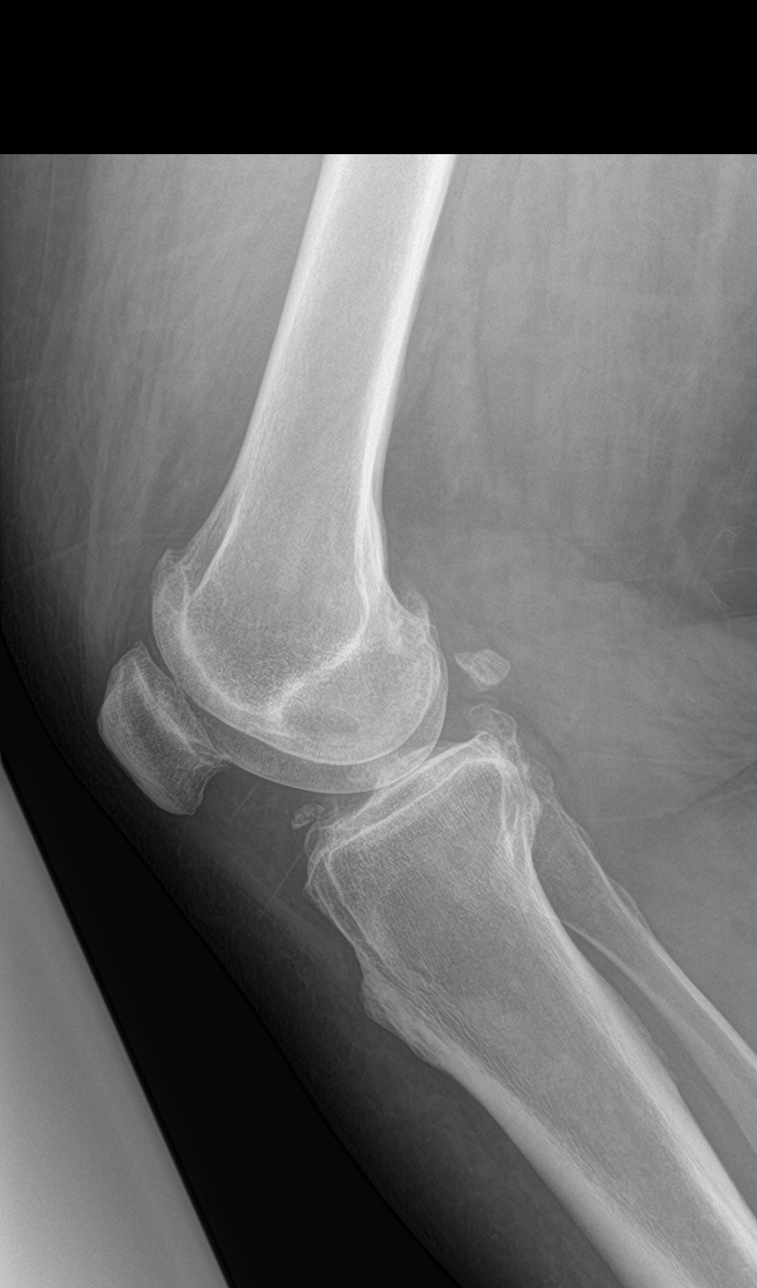

[4 of 4 positions shown; findings below may reference images not displayed]

FINDINGS: No evidence of fracture, dislocation, or joint effusion. Mild medial
and lateral tibiofemoral compartment space narrowing is noted. Soft
tissues are unremarkable.
IMPRESSION: Mild degenerative changes without acute osseous abnormality.

## 2022-01-29 IMAGING — CR DG ANKLE COMPLETE 3+V*R*
3 series · 3 of 3 positions shown · non-contrast
Comparison: None.

CLINICAL DATA: Status post fall.

EXAM:
RIGHT ANKLE - COMPLETE 3+ VIEW

[ankle ap]
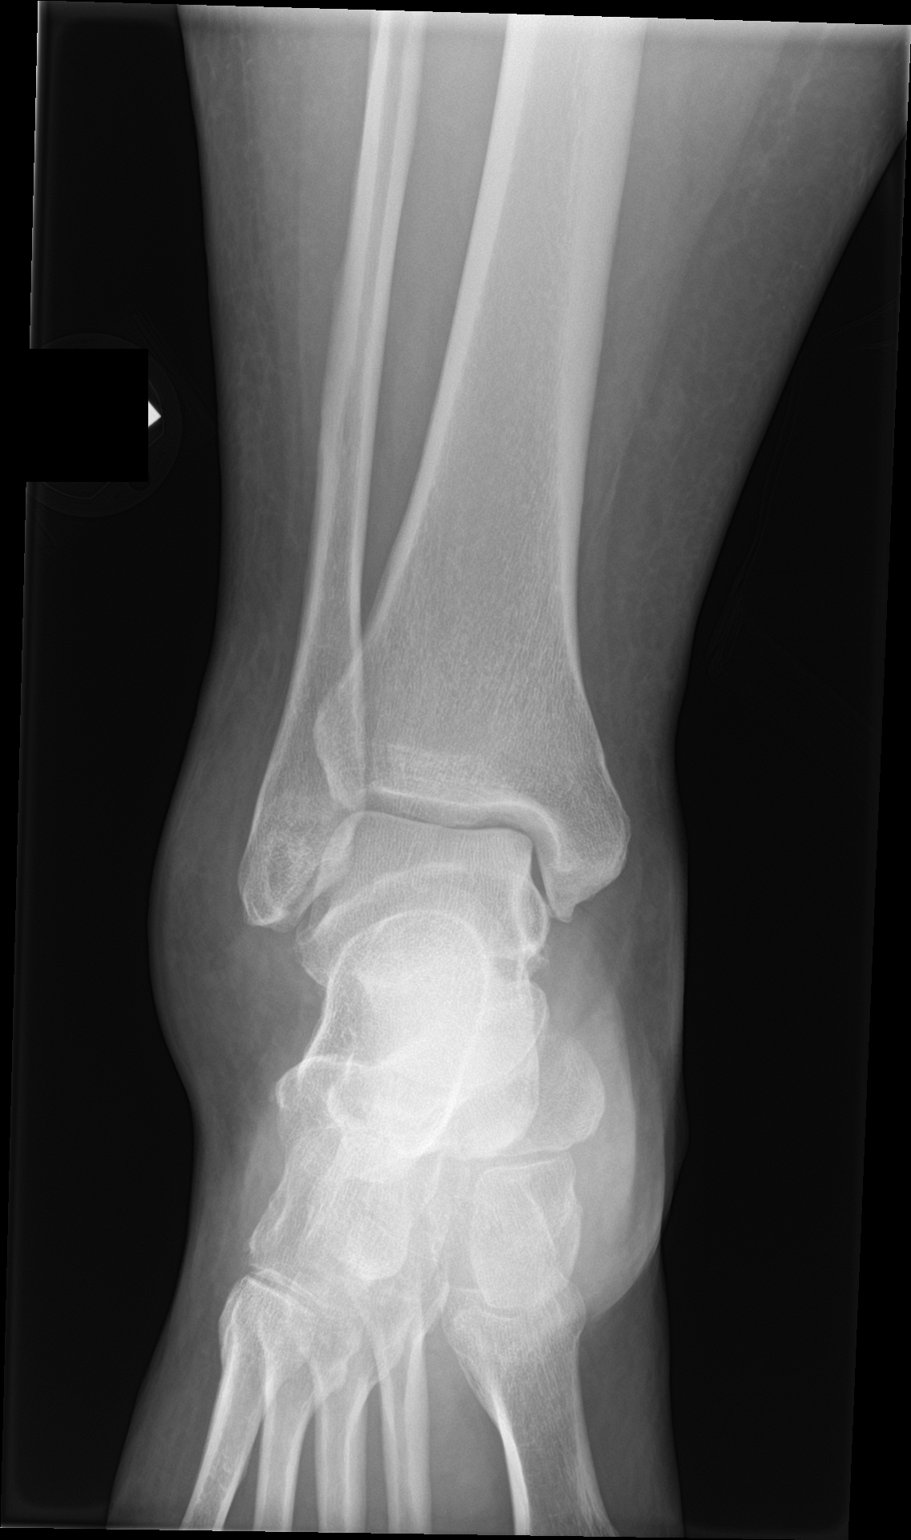

[ankle obl]
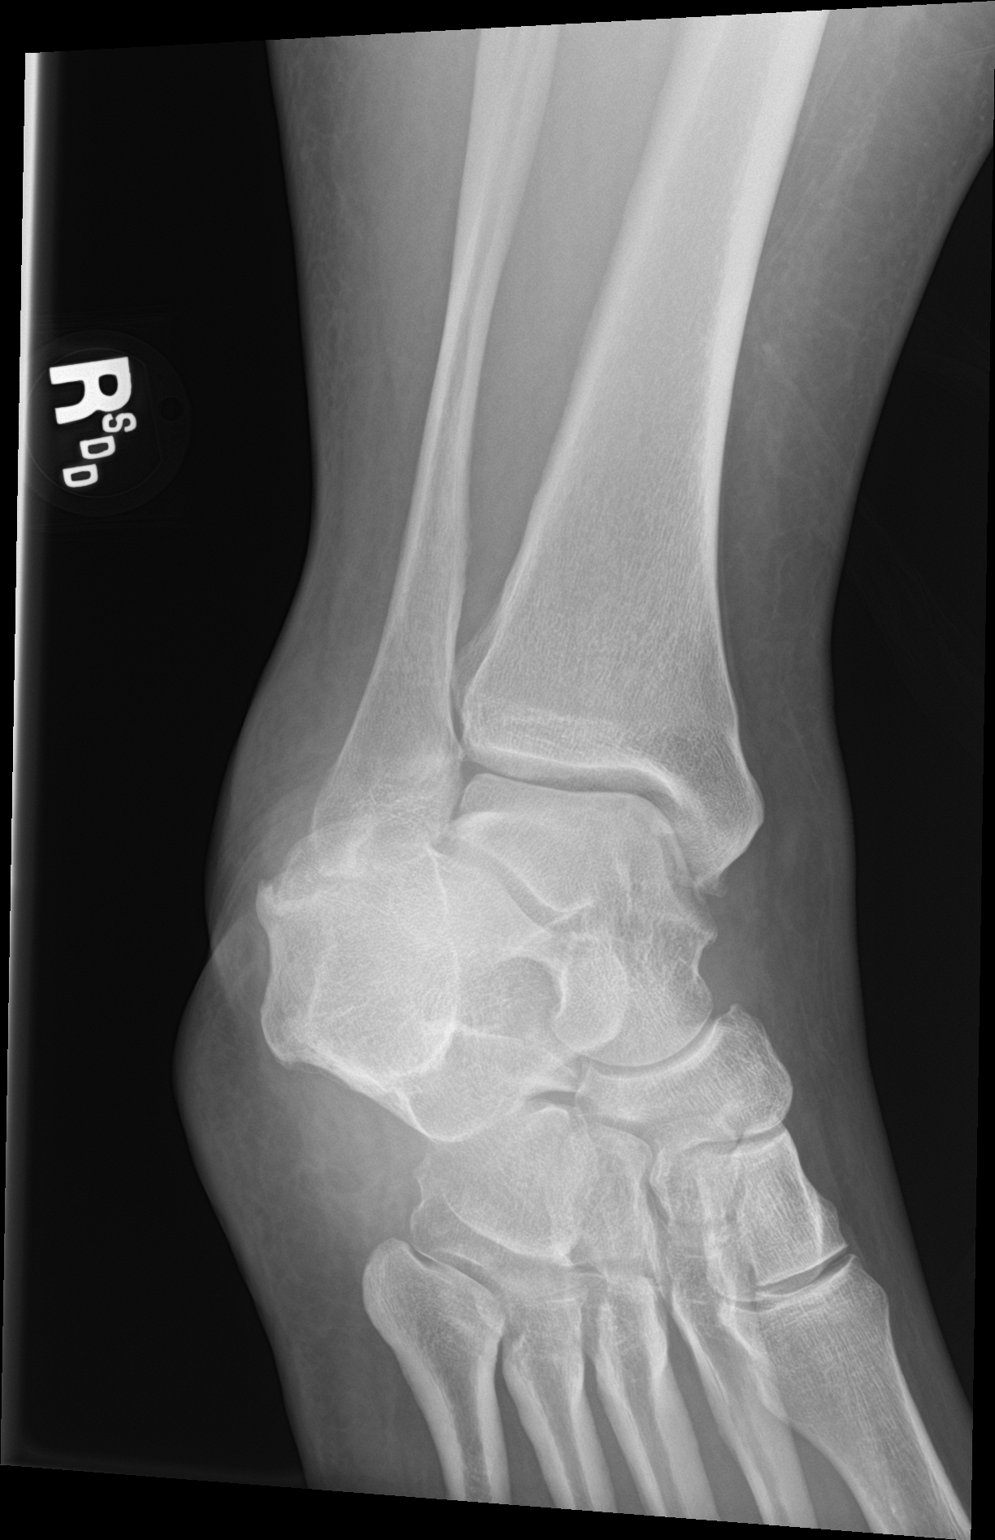

[ankle lat]
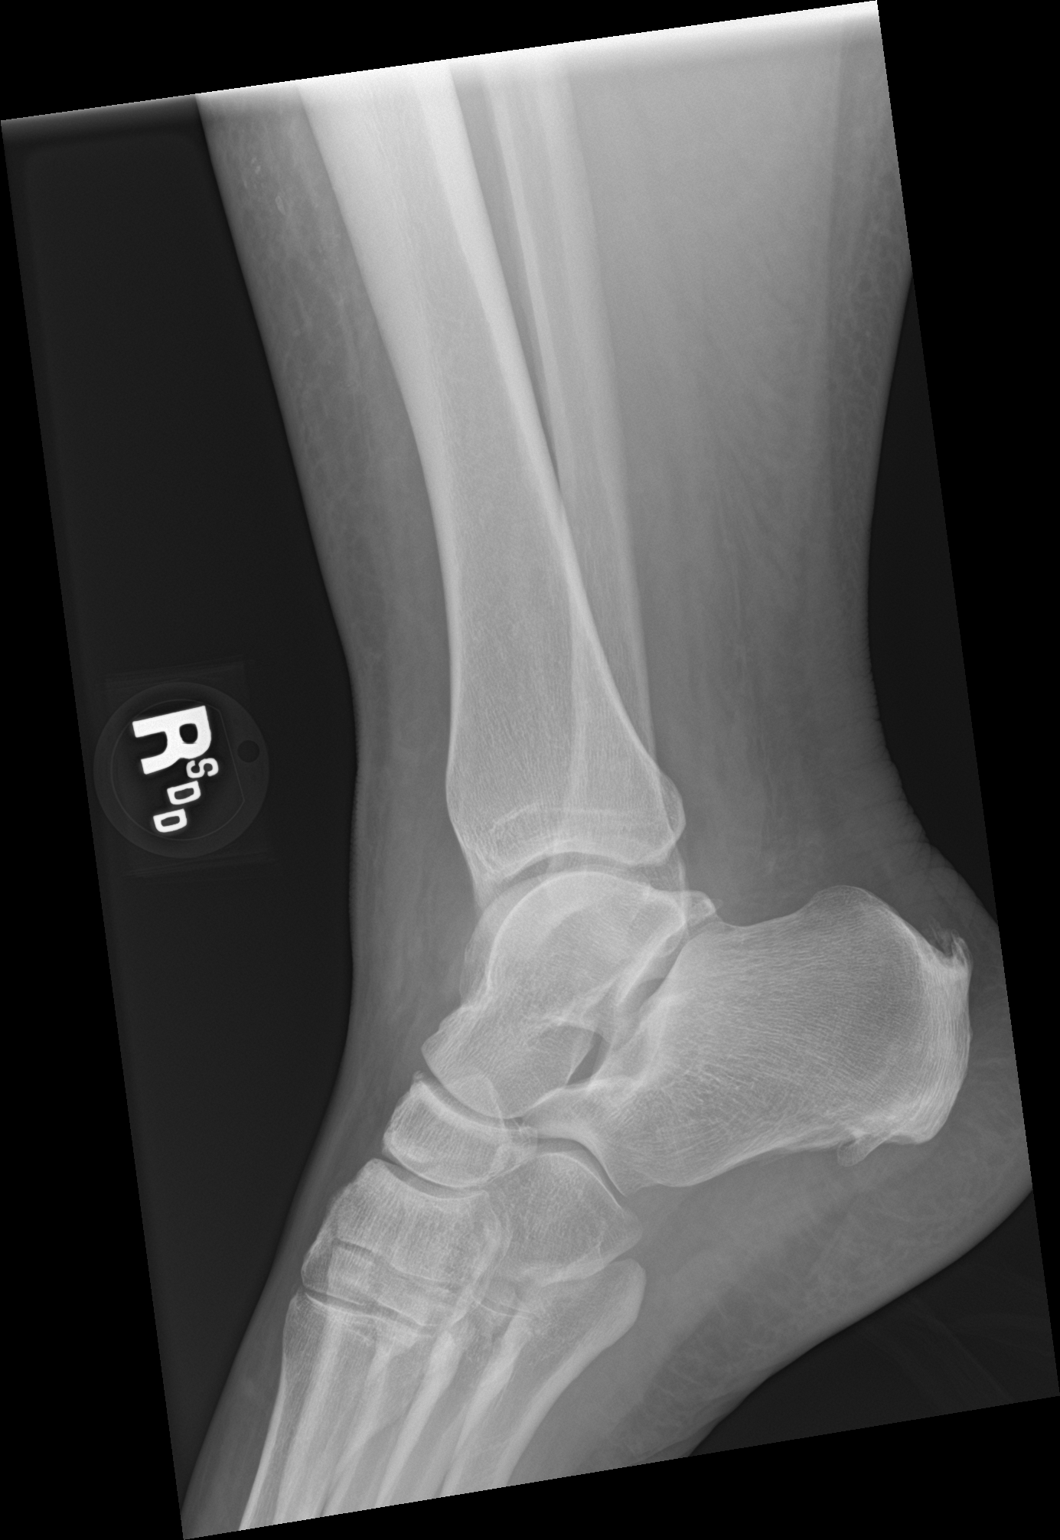

[3 of 3 positions shown; findings below may reference images not displayed]

FINDINGS: There is no evidence of fracture, dislocation, or joint effusion.
There is no evidence of arthropathy or other focal bone abnormality.
Moderate severity, predominately anterior and lateral, soft tissue
swelling is seen.
IMPRESSION: Moderate severity soft tissue swelling without an acute osseous
abnormality.

## 2022-02-06 ENCOUNTER — Ambulatory Visit: Admit: 2022-02-06 | Discharge: 2022-02-07 | Payer: PRIVATE HEALTH INSURANCE

## 2022-02-06 DIAGNOSIS — J45909 Unspecified asthma, uncomplicated: Principal | ICD-10-CM

## 2022-02-06 DIAGNOSIS — J302 Other seasonal allergic rhinitis: Principal | ICD-10-CM

## 2022-02-06 DIAGNOSIS — Z720 Tobacco use: Principal | ICD-10-CM

## 2022-02-06 DIAGNOSIS — M545 Chronic low back pain, unspecified back pain laterality, unspecified whether sciatica present: Principal | ICD-10-CM

## 2022-02-06 DIAGNOSIS — G8929 Other chronic pain: Principal | ICD-10-CM

## 2022-02-06 DIAGNOSIS — F1910 Other psychoactive substance abuse, uncomplicated: Principal | ICD-10-CM

## 2022-02-06 DIAGNOSIS — E119 Type 2 diabetes mellitus without complications: Principal | ICD-10-CM

## 2022-02-06 DIAGNOSIS — Z6841 Body Mass Index (BMI) 40.0 and over, adult: Principal | ICD-10-CM

## 2022-02-06 DIAGNOSIS — L732 Hidradenitis suppurativa: Principal | ICD-10-CM

## 2022-02-06 DIAGNOSIS — Z23 Encounter for immunization: Secondary | ICD-10-CM | POA: Diagnosis not present

## 2022-02-06 MED ORDER — ALBUTEROL SULFATE HFA 90 MCG/ACTUATION AEROSOL INHALER
Freq: Four times a day (QID) | RESPIRATORY_TRACT | 0 refills | 0 days | Status: CP | PRN
Start: 2022-02-06 — End: 2023-02-06

## 2022-02-06 MED ORDER — CETIRIZINE 10 MG TABLET
ORAL_TABLET | Freq: Every day | ORAL | 3 refills | 90.00000 days | Status: CP
Start: 2022-02-06 — End: 2023-02-06

## 2022-02-06 MED ORDER — BUDESONIDE-FORMOTEROL HFA 160 MCG-4.5 MCG/ACTUATION AEROSOL INHALER
Freq: Two times a day (BID) | RESPIRATORY_TRACT | 6 refills | 31 days | Status: CP
Start: 2022-02-06 — End: 2023-02-06

## 2022-02-08 DIAGNOSIS — Z419 Encounter for procedure for purposes other than remedying health state, unspecified: Secondary | ICD-10-CM | POA: Diagnosis not present

## 2022-02-12 MED ORDER — OZEMPIC 0.25 MG OR 0.5 MG (2 MG/3 ML) SUBCUTANEOUS PEN INJECTOR
0 refills | 0 days | Status: CP
Start: 2022-02-12 — End: ?
  Filled 2022-02-17: qty 3, 28d supply, fill #0

## 2022-02-25 ENCOUNTER — Ambulatory Visit: Admit: 2022-02-25 | Discharge: 2022-02-26 | Payer: PRIVATE HEALTH INSURANCE

## 2022-02-25 DIAGNOSIS — H6091 Unspecified otitis externa, right ear: Principal | ICD-10-CM

## 2022-02-25 DIAGNOSIS — J45909 Unspecified asthma, uncomplicated: Principal | ICD-10-CM

## 2022-02-25 MED ORDER — NEOMYCIN-POLYMYXIN-HYDROCORT 3.5 MG-10,000 UNIT/ML-1 % EAR DROPS,SUSP
Freq: Four times a day (QID) | OTIC | 0 refills | 17 days | Status: CP
Start: 2022-02-25 — End: 2022-03-04

## 2022-03-11 ENCOUNTER — Ambulatory Visit: Payer: Medicaid Other | Admitting: Dermatology

## 2022-03-11 DIAGNOSIS — Z419 Encounter for procedure for purposes other than remedying health state, unspecified: Secondary | ICD-10-CM | POA: Diagnosis not present

## 2022-03-26 DIAGNOSIS — H5203 Hypermetropia, bilateral: Secondary | ICD-10-CM | POA: Diagnosis not present

## 2022-03-26 DIAGNOSIS — H5213 Myopia, bilateral: Secondary | ICD-10-CM | POA: Diagnosis not present

## 2022-04-10 ENCOUNTER — Encounter: Payer: Self-pay | Admitting: Dermatology

## 2022-04-11 DIAGNOSIS — Z419 Encounter for procedure for purposes other than remedying health state, unspecified: Secondary | ICD-10-CM | POA: Diagnosis not present

## 2022-05-05 ENCOUNTER — Encounter: Payer: Self-pay | Admitting: Dermatology

## 2022-05-05 DIAGNOSIS — B379 Candidiasis, unspecified: Secondary | ICD-10-CM

## 2022-05-06 MED ORDER — FLUCONAZOLE 150 MG PO TABS: 150.0000 mg | ORAL_TABLET | Freq: Every day | ORAL | 1 refills | Status: AC

## 2022-05-11 DIAGNOSIS — Z419 Encounter for procedure for purposes other than remedying health state, unspecified: Secondary | ICD-10-CM | POA: Diagnosis not present

## 2022-05-21 ENCOUNTER — Ambulatory Visit: Admit: 2022-05-21 | Discharge: 2022-05-22 | Payer: PRIVATE HEALTH INSURANCE

## 2022-05-21 DIAGNOSIS — E119 Type 2 diabetes mellitus without complications: Principal | ICD-10-CM

## 2022-05-21 DIAGNOSIS — Z6841 Body Mass Index (BMI) 40.0 and over, adult: Principal | ICD-10-CM

## 2022-05-21 DIAGNOSIS — Z1231 Encounter for screening mammogram for malignant neoplasm of breast: Principal | ICD-10-CM

## 2022-05-21 DIAGNOSIS — N760 Acute vaginitis: Principal | ICD-10-CM

## 2022-05-21 DIAGNOSIS — Z720 Tobacco use: Principal | ICD-10-CM

## 2022-05-21 DIAGNOSIS — Z01419 Encounter for gynecological examination (general) (routine) without abnormal findings: Principal | ICD-10-CM

## 2022-05-21 DIAGNOSIS — Z124 Encounter for screening for malignant neoplasm of cervix: Principal | ICD-10-CM

## 2022-05-21 DIAGNOSIS — Z0001 Encounter for general adult medical examination with abnormal findings: Secondary | ICD-10-CM | POA: Diagnosis not present

## 2022-05-21 DIAGNOSIS — Z23 Encounter for immunization: Secondary | ICD-10-CM | POA: Diagnosis not present

## 2022-05-21 MED ORDER — FLUCONAZOLE 150 MG TABLET
ORAL_TABLET | Freq: Once | ORAL | 0 refills | 2 days | Status: CP
Start: 2022-05-21 — End: 2022-05-21

## 2022-05-23 MED ORDER — OMEPRAZOLE 40 MG CAPSULE,DELAYED RELEASE
ORAL_CAPSULE | Freq: Every day | ORAL | 1 refills | 90 days | Status: CP
Start: 2022-05-23 — End: 2023-05-23

## 2022-05-23 MED ORDER — OZEMPIC 0.25 MG OR 0.5 MG (2 MG/3 ML) SUBCUTANEOUS PEN INJECTOR
SUBCUTANEOUS | 0 refills | 28 days | Status: CP
Start: 2022-05-23 — End: ?
  Filled 2022-05-26: qty 3, 28d supply, fill #0

## 2022-05-27 ENCOUNTER — Other Ambulatory Visit: Payer: Self-pay

## 2022-05-27 MED ORDER — HUMIRA (2 PEN) 80 MG/0.8ML ~~LOC~~ PNKT: 80.0000 mg | PEN_INJECTOR | SUBCUTANEOUS | 2 refills | Status: DC

## 2022-05-27 NOTE — Progress Notes (Signed)
Refill request faxed from [facility]. Escripted  

## 2022-06-11 DIAGNOSIS — Z419 Encounter for procedure for purposes other than remedying health state, unspecified: Secondary | ICD-10-CM | POA: Diagnosis not present

## 2022-07-08 ENCOUNTER — Ambulatory Visit: Payer: Medicaid Other | Admitting: Dermatology

## 2022-07-09 ENCOUNTER — Ambulatory Visit: Admit: 2022-07-09 | Payer: PRIVATE HEALTH INSURANCE | Attending: Internal Medicine | Primary: Internal Medicine

## 2022-07-09 ENCOUNTER — Ambulatory Visit: Admit: 2022-07-09 | Payer: PRIVATE HEALTH INSURANCE

## 2022-07-11 DIAGNOSIS — Z419 Encounter for procedure for purposes other than remedying health state, unspecified: Secondary | ICD-10-CM | POA: Diagnosis not present

## 2022-07-13 ENCOUNTER — Emergency Department
Admit: 2022-07-13 | Discharge: 2022-07-13 | Disposition: A | Discharge status: Home / Self Care | Payer: PRIVATE HEALTH INSURANCE | Attending: Emergency Medicine

## 2022-07-13 ENCOUNTER — Ambulatory Visit
Admit: 2022-07-13 | Discharge: 2022-07-13 | Disposition: A | Discharge status: Home / Self Care | Payer: PRIVATE HEALTH INSURANCE | Attending: Emergency Medicine

## 2022-07-13 DIAGNOSIS — B338 Other specified viral diseases: Principal | ICD-10-CM

## 2022-07-13 DIAGNOSIS — B974 Respiratory syncytial virus as the cause of diseases classified elsewhere: Secondary | ICD-10-CM | POA: Diagnosis not present

## 2022-07-13 DIAGNOSIS — Z20822 Contact with and (suspected) exposure to covid-19: Secondary | ICD-10-CM | POA: Diagnosis not present

## 2022-07-13 DIAGNOSIS — R0600 Dyspnea, unspecified: Secondary | ICD-10-CM | POA: Diagnosis not present

## 2022-07-13 DIAGNOSIS — J069 Acute upper respiratory infection, unspecified: Secondary | ICD-10-CM | POA: Diagnosis not present

## 2022-07-13 MED ORDER — PREDNISONE 20 MG TABLET
ORAL_TABLET | Freq: Every day | ORAL | 0 refills | 5 days | Status: CP
Start: 2022-07-13 — End: 2022-07-18

## 2022-07-13 MED ORDER — ALBUTEROL SULFATE 1.25 MG/3 ML SOLUTION FOR NEBULIZATION
RESPIRATORY_TRACT | 0 refills | 5 days | Status: CP | PRN
Start: 2022-07-13 — End: ?

## 2022-07-13 MED ORDER — AZITHROMYCIN 250 MG TABLET
ORAL_TABLET | ORAL | 0 refills | 6 days | Status: CP
Start: 2022-07-13 — End: 2022-07-19

## 2022-07-22 ENCOUNTER — Telehealth: Admit: 2022-07-22 | Discharge: 2022-07-23 | Payer: PRIVATE HEALTH INSURANCE

## 2022-07-22 DIAGNOSIS — J40 Bronchitis, not specified as acute or chronic: Principal | ICD-10-CM

## 2022-07-22 DIAGNOSIS — J210 Acute bronchiolitis due to respiratory syncytial virus: Principal | ICD-10-CM

## 2022-07-22 MED ORDER — PREDNISONE 20 MG TABLET
ORAL_TABLET | Freq: Every day | ORAL | 0 refills | 5 days | Status: CP
Start: 2022-07-22 — End: ?

## 2022-07-22 MED ORDER — ALBUTEROL SULFATE HFA 90 MCG/ACTUATION AEROSOL INHALER
Freq: Four times a day (QID) | RESPIRATORY_TRACT | 6 refills | 0 days | Status: CP | PRN
Start: 2022-07-22 — End: 2023-07-22

## 2022-07-30 ENCOUNTER — Ambulatory Visit: Payer: Medicaid Other | Admitting: Dermatology

## 2022-08-11 DIAGNOSIS — Z419 Encounter for procedure for purposes other than remedying health state, unspecified: Secondary | ICD-10-CM | POA: Diagnosis not present

## 2022-09-09 ENCOUNTER — Ambulatory Visit: Admit: 2022-09-09 | Payer: PRIVATE HEALTH INSURANCE

## 2022-09-11 DIAGNOSIS — Z419 Encounter for procedure for purposes other than remedying health state, unspecified: Secondary | ICD-10-CM | POA: Diagnosis not present

## 2022-09-23 ENCOUNTER — Ambulatory Visit: Admit: 2022-09-23 | Payer: PRIVATE HEALTH INSURANCE

## 2022-10-07 ENCOUNTER — Other Ambulatory Visit: Payer: Self-pay | Admitting: Dermatology

## 2022-10-10 ENCOUNTER — Telehealth
Admit: 2022-10-10 | Discharge: 2022-10-11 | Payer: PRIVATE HEALTH INSURANCE | Attending: Family Medicine | Primary: Family Medicine

## 2022-10-10 DIAGNOSIS — B9689 Other specified bacterial agents as the cause of diseases classified elsewhere: Principal | ICD-10-CM

## 2022-10-10 DIAGNOSIS — J329 Chronic sinusitis, unspecified: Principal | ICD-10-CM

## 2022-10-10 DIAGNOSIS — Z419 Encounter for procedure for purposes other than remedying health state, unspecified: Secondary | ICD-10-CM | POA: Diagnosis not present

## 2022-10-10 MED ORDER — AMOXICILLIN 875 MG-POTASSIUM CLAVULANATE 125 MG TABLET
ORAL_TABLET | Freq: Two times a day (BID) | ORAL | 0 refills | 5 days | Status: CP
Start: 2022-10-10 — End: 2022-10-15

## 2022-10-10 MED ORDER — PREDNISONE 20 MG TABLET
ORAL_TABLET | Freq: Every day | ORAL | 0 refills | 5 days | Status: CP
Start: 2022-10-10 — End: 2022-10-15

## 2022-11-10 DIAGNOSIS — Z419 Encounter for procedure for purposes other than remedying health state, unspecified: Secondary | ICD-10-CM | POA: Diagnosis not present

## 2022-12-10 DIAGNOSIS — Z419 Encounter for procedure for purposes other than remedying health state, unspecified: Secondary | ICD-10-CM | POA: Diagnosis not present

## 2022-12-31 MED ORDER — OMEPRAZOLE 40 MG CAPSULE,DELAYED RELEASE
ORAL_CAPSULE | Freq: Every day | ORAL | 1 refills | 90 days | Status: CP
Start: 2022-12-31 — End: 2023-12-31

## 2023-01-02 ENCOUNTER — Ambulatory Visit
Admit: 2023-01-02 | Discharge: 2023-01-02 | Disposition: A | Discharge status: Home / Self Care | Payer: PRIVATE HEALTH INSURANCE | Attending: Emergency Medicine

## 2023-01-02 ENCOUNTER — Emergency Department
Admit: 2023-01-02 | Discharge: 2023-01-02 | Disposition: A | Discharge status: Home / Self Care | Payer: PRIVATE HEALTH INSURANCE | Attending: Emergency Medicine

## 2023-01-02 DIAGNOSIS — M25561 Pain in right knee: Principal | ICD-10-CM

## 2023-01-02 DIAGNOSIS — G8929 Other chronic pain: Principal | ICD-10-CM

## 2023-01-02 DIAGNOSIS — M1711 Unilateral primary osteoarthritis, right knee: Secondary | ICD-10-CM | POA: Diagnosis not present

## 2023-01-10 DIAGNOSIS — Z419 Encounter for procedure for purposes other than remedying health state, unspecified: Secondary | ICD-10-CM | POA: Diagnosis not present

## 2023-01-21 ENCOUNTER — Emergency Department
Admit: 2023-01-21 | Discharge: 2023-01-21 | Disposition: A | Discharge status: Home / Self Care | Payer: PRIVATE HEALTH INSURANCE

## 2023-01-21 ENCOUNTER — Ambulatory Visit
Admit: 2023-01-21 | Discharge: 2023-01-21 | Disposition: A | Discharge status: Home / Self Care | Payer: PRIVATE HEALTH INSURANCE

## 2023-01-21 DIAGNOSIS — R2243 Localized swelling, mass and lump, lower limb, bilateral: Principal | ICD-10-CM

## 2023-01-21 DIAGNOSIS — M79661 Pain in right lower leg: Secondary | ICD-10-CM | POA: Diagnosis not present

## 2023-01-21 DIAGNOSIS — M7989 Other specified soft tissue disorders: Secondary | ICD-10-CM | POA: Diagnosis not present

## 2023-01-21 DIAGNOSIS — M79662 Pain in left lower leg: Secondary | ICD-10-CM | POA: Diagnosis not present

## 2023-01-28 DIAGNOSIS — M5416 Radiculopathy, lumbar region: Principal | ICD-10-CM

## 2023-01-28 DIAGNOSIS — E119 Type 2 diabetes mellitus without complications: Principal | ICD-10-CM

## 2023-01-29 ENCOUNTER — Ambulatory Visit: Admit: 2023-01-29 | Discharge: 2023-01-30 | Payer: PRIVATE HEALTH INSURANCE

## 2023-01-29 DIAGNOSIS — H6592 Unspecified nonsuppurative otitis media, left ear: Principal | ICD-10-CM

## 2023-01-29 DIAGNOSIS — R739 Hyperglycemia, unspecified: Principal | ICD-10-CM

## 2023-01-29 DIAGNOSIS — R062 Wheezing: Principal | ICD-10-CM

## 2023-01-29 DIAGNOSIS — Z09 Encounter for follow-up examination after completed treatment for conditions other than malignant neoplasm: Secondary | ICD-10-CM | POA: Diagnosis not present

## 2023-01-29 DIAGNOSIS — J4541 Moderate persistent asthma with (acute) exacerbation: Secondary | ICD-10-CM | POA: Diagnosis not present

## 2023-01-29 DIAGNOSIS — E1169 Type 2 diabetes mellitus with other specified complication: Secondary | ICD-10-CM | POA: Diagnosis not present

## 2023-01-29 MED ORDER — BLOOD SUGAR DIAGNOSTIC STRIPS
ORAL_STRIP | 1 refills | 0 days | Status: CP
Start: 2023-01-29 — End: 2024-01-29

## 2023-01-29 MED ORDER — ALBUTEROL SULFATE 2.5 MG/3 ML (0.083 %) SOLUTION FOR NEBULIZATION
Freq: Four times a day (QID) | RESPIRATORY_TRACT | 0 refills | 8 days | Status: CP | PRN
Start: 2023-01-29 — End: 2024-01-29

## 2023-01-29 MED ORDER — AMOXICILLIN 875 MG TABLET
ORAL_TABLET | Freq: Two times a day (BID) | ORAL | 0 refills | 7 days | Status: CP
Start: 2023-01-29 — End: 2023-02-05

## 2023-01-29 MED ORDER — METFORMIN ER 500 MG TABLET,EXTENDED RELEASE 24 HR
ORAL_TABLET | Freq: Two times a day (BID) | ORAL | 0 refills | 90 days | Status: CP
Start: 2023-01-29 — End: 2023-01-29

## 2023-02-02 ENCOUNTER — Telehealth: Payer: Self-pay

## 2023-02-02 DIAGNOSIS — L732 Hidradenitis suppurativa: Secondary | ICD-10-CM

## 2023-02-02 NOTE — Telephone Encounter (Signed)
Left message on voicemail to return my call.  

## 2023-02-02 NOTE — Telephone Encounter (Signed)
Scheduled patient next available appt which is late October. Ok to refill Humira for HS until then? Patient uses [facility].

## 2023-02-03 ENCOUNTER — Ambulatory Visit: Payer: Medicaid Other | Admitting: Dermatology

## 2023-02-04 MED ORDER — HUMIRA (2 PEN) 80 MG/0.8ML ~~LOC~~ PNKT: PEN_INJECTOR | SUBCUTANEOUS | 0 refills | Status: AC

## 2023-02-04 NOTE — Addendum Note (Signed)
Addended by: Cari Caraway A on: 02/04/2023 02:31 PM   Modules accepted: Orders

## 2023-02-04 NOTE — Telephone Encounter (Signed)
Patient rescheduled to Dr. Michaelle Copas schedule and refills sent to pharmacy.

## 2023-02-09 DIAGNOSIS — Z419 Encounter for procedure for purposes other than remedying health state, unspecified: Secondary | ICD-10-CM | POA: Diagnosis not present

## 2023-02-17 ENCOUNTER — Ambulatory Visit: Admit: 2023-02-17 | Discharge: 2023-02-18 | Payer: PRIVATE HEALTH INSURANCE

## 2023-02-17 DIAGNOSIS — L732 Hidradenitis suppurativa: Principal | ICD-10-CM

## 2023-02-17 DIAGNOSIS — Z1231 Encounter for screening mammogram for malignant neoplasm of breast: Principal | ICD-10-CM

## 2023-02-17 DIAGNOSIS — G629 Polyneuropathy, unspecified: Principal | ICD-10-CM

## 2023-02-17 DIAGNOSIS — E119 Type 2 diabetes mellitus without complications: Principal | ICD-10-CM

## 2023-02-17 DIAGNOSIS — F1910 Other psychoactive substance abuse, uncomplicated: Principal | ICD-10-CM

## 2023-02-17 DIAGNOSIS — E1169 Type 2 diabetes mellitus with other specified complication: Principal | ICD-10-CM

## 2023-02-17 DIAGNOSIS — Z6841 Body Mass Index (BMI) 40.0 and over, adult: Principal | ICD-10-CM

## 2023-02-17 DIAGNOSIS — J4541 Moderate persistent asthma with (acute) exacerbation: Principal | ICD-10-CM

## 2023-02-17 DIAGNOSIS — Z720 Tobacco use: Principal | ICD-10-CM

## 2023-02-17 DIAGNOSIS — F32A Depression, unspecified depression type: Principal | ICD-10-CM

## 2023-02-17 MED ORDER — LANCETS
11 refills | 0 days | Status: CP
Start: 2023-02-17 — End: ?

## 2023-02-17 MED ORDER — BLOOD-GLUCOSE METER KIT WRAPPER
Freq: Once | 0 refills | 0 days | Status: CP
Start: 2023-02-17 — End: 2023-02-17

## 2023-02-17 MED ORDER — OZEMPIC 0.25 MG OR 0.5 MG (2 MG/3 ML) SUBCUTANEOUS PEN INJECTOR
SUBCUTANEOUS | 0 refills | 56 days | Status: CP
Start: 2023-02-17 — End: ?

## 2023-02-17 MED ORDER — VARENICLINE 0.5 MG (11)-1 MG (42) TABLETS IN A DOSE PACK
0 refills | 0 days | Status: CP
Start: 2023-02-17 — End: 2023-05-18

## 2023-02-17 MED ORDER — BLOOD SUGAR DIAGNOSTIC STRIPS
ORAL_STRIP | 6 refills | 0 days | Status: CP
Start: 2023-02-17 — End: 2024-02-17

## 2023-02-21 DIAGNOSIS — E1169 Type 2 diabetes mellitus with other specified complication: Principal | ICD-10-CM

## 2023-02-24 NOTE — Unmapped (Signed)
 [facility] Specialty Medication Onboarding    Specialty Medication: OZEMPIC 0.25 mg or 0.5 mg (2 mg/3 mL) Pnij (semaglutide)  Prior Authorization: Approved   Financial Assistance: No - copay  <$25  Final Copay/Day Supply: $4 / 56    Insurance Restrictions: None     Notes to Pharmacist: new to therapy  Credit Card on File: no    The triage team has completed the benefits investigation and has determined that the patient is able to fill this medication at [facility]. Please contact the patient to complete the onboarding or follow up with the prescribing physician as needed.

## 2023-02-25 ENCOUNTER — Ambulatory Visit
Admit: 2023-02-25 | Payer: PRIVATE HEALTH INSURANCE | Attending: Rehabilitative and Restorative Service Providers" | Primary: Rehabilitative and Restorative Service Providers"

## 2023-03-02 ENCOUNTER — Ambulatory Visit: Payer: Medicaid Other | Admitting: Dermatology

## 2023-03-05 NOTE — Unmapped (Signed)
[facility] has reached out to this patient via MyChart to onboard them to our Specialty Lite services for their Ozempic. Their medication was last delivered on 05/26/2022.They will now receive proactive outreach from the pharmacy team for refills.    Racheal Patches Pharmacy Candidate  [facility]

## 2023-03-12 DIAGNOSIS — Z419 Encounter for procedure for purposes other than remedying health state, unspecified: Secondary | ICD-10-CM | POA: Diagnosis not present

## 2023-03-16 ENCOUNTER — Other Ambulatory Visit: Payer: Self-pay | Admitting: Dermatology

## 2023-03-16 DIAGNOSIS — J45909 Unspecified asthma, uncomplicated: Principal | ICD-10-CM

## 2023-03-16 DIAGNOSIS — L732 Hidradenitis suppurativa: Secondary | ICD-10-CM

## 2023-03-16 MED ORDER — ALBUTEROL SULFATE 2.5 MG/3 ML (0.083 %) SOLUTION FOR NEBULIZATION
Freq: Four times a day (QID) | RESPIRATORY_TRACT | 1 refills | 10 days | Status: CP | PRN
Start: 2023-03-16 — End: 2024-03-15

## 2023-03-16 MED ORDER — BUDESONIDE-FORMOTEROL HFA 160 MCG-4.5 MCG/ACTUATION AEROSOL INHALER
Freq: Two times a day (BID) | RESPIRATORY_TRACT | 1 refills | 92 days | Status: CP
Start: 2023-03-16 — End: 2024-03-15

## 2023-03-19 ENCOUNTER — Ambulatory Visit: Admit: 2023-03-19 | Payer: PRIVATE HEALTH INSURANCE

## 2023-04-12 DIAGNOSIS — Z419 Encounter for procedure for purposes other than remedying health state, unspecified: Secondary | ICD-10-CM | POA: Diagnosis not present

## 2023-04-17 NOTE — Unmapped (Signed)
CM did not call d/t patient contacted within 90 days.

## 2023-04-22 ENCOUNTER — Ambulatory Visit: Admit: 2023-04-22 | Payer: PRIVATE HEALTH INSURANCE

## 2023-05-12 DIAGNOSIS — Z419 Encounter for procedure for purposes other than remedying health state, unspecified: Secondary | ICD-10-CM | POA: Diagnosis not present

## 2023-06-01 ENCOUNTER — Ambulatory Visit: Payer: Medicaid Other | Admitting: Dermatology

## 2023-06-12 DIAGNOSIS — Z419 Encounter for procedure for purposes other than remedying health state, unspecified: Secondary | ICD-10-CM | POA: Diagnosis not present

## 2023-06-17 ENCOUNTER — Ambulatory Visit
Admission: EM | Admit: 2023-06-17 | Discharge: 2023-06-17 | Disposition: A | Discharge status: Home / Self Care | Payer: Medicaid Other | Attending: Emergency Medicine | Admitting: Emergency Medicine

## 2023-06-17 ENCOUNTER — Encounter: Payer: Self-pay | Admitting: Emergency Medicine

## 2023-06-17 DIAGNOSIS — J4541 Moderate persistent asthma with (acute) exacerbation: Secondary | ICD-10-CM | POA: Diagnosis not present

## 2023-06-17 DIAGNOSIS — J0100 Acute maxillary sinusitis, unspecified: Secondary | ICD-10-CM | POA: Diagnosis not present

## 2023-06-17 MED ORDER — AMOXICILLIN-POT CLAVULANATE 875-125 MG PO TABS: 1.0000 | ORAL_TABLET | Freq: Two times a day (BID) | ORAL | 0 refills | Status: AC

## 2023-06-17 MED ORDER — PREDNISONE 10 MG PO TABS: 40.0000 mg | ORAL_TABLET | Freq: Every day | ORAL | 0 refills | Status: AC

## 2023-06-17 MED ORDER — ALBUTEROL SULFATE (2.5 MG/3ML) 0.083% IN NEBU: 2.5000 mg | INHALATION_SOLUTION | Freq: Four times a day (QID) | RESPIRATORY_TRACT | 0 refills | Status: AC | PRN

## 2023-06-17 MED ORDER — ALBUTEROL SULFATE HFA 108 (90 BASE) MCG/ACT IN AERS: 1.0000 | INHALATION_SPRAY | Freq: Four times a day (QID) | RESPIRATORY_TRACT | 0 refills | Status: AC | PRN

## 2023-06-17 NOTE — Discharge Instructions (Addendum)
Use the albuterol inhaler or nebulizer treatment as directed.  Take the prednisone and Augmentin as directed.  Follow up with your primary care provider tomorrow.  Go to the emergency department if you have worsening symptoms.

## 2023-06-17 NOTE — ED Triage Notes (Signed)
Patient in office today complaint of cough, nasal congestion ,sob, bilateral ear pain and asthma.  Sx started x 1wk  states she had fever for 3-4 days then went away,   OTC: tylenol cold and sinus and motrin  Denies: N/V

## 2023-06-17 NOTE — ED Provider Notes (Signed)
Renaldo Fiddler    CSN: [id number] Arrival date & time: 06/17/23  1643      History   Chief Complaint Chief Complaint  Patient presents with   Nasal Congestion   Facial Pain   Shortness of Breath   Cough    HPI Teresa Malone is a 42 y.o. female.  Patient presents with 10-day history of ear pain, congestion, postnasal drip, cough, wheezing, shortness of breath.  Her symptoms improved and then worsened 4 days ago.  She used an albuterol nebulizer treatment this morning but states she is running low on the albuterol solution.  She has taken Tylenol cold medicine.  No fever, chest pain, or other symptoms.  Her medical history includes asthma, diabetes, and morbid obesity.  The history is provided by the patient and medical records.    Past Medical History:  Diagnosis Date   Asthma    Atypical chest pain    Bronchitis    COVID-19 virus infection 09/2020   Diabetes mellitus without complication (HCC)    Dyspnea    GERD (gastroesophageal reflux disease)    Hypoglycemia    Morbid obesity (HCC)    Sleep apnea     Patient Active Problem List   Diagnosis Date Noted   Morbid obesity (HCC)    COVID-19 virus infection 09/2020    Past Surgical History:  Procedure Laterality Date   CARPAL TUNNEL RELEASE     CHOLECYSTECTOMY  2003   COLONOSCOPY WITH PROPOFOL N/A 10/27/2017   Procedure: COLONOSCOPY WITH PROPOFOL;  Surgeon: Toledo, Boykin Nearing, MD;  Location: [facility] ENDOSCOPY;  Service: Gastroenterology;  Laterality: N/A;   ESOPHAGOGASTRODUODENOSCOPY (EGD) WITH PROPOFOL N/A 10/27/2017   Procedure: ESOPHAGOGASTRODUODENOSCOPY (EGD) WITH PROPOFOL;  Surgeon: Toledo, Boykin Nearing, MD;  Location: [facility] ENDOSCOPY;  Service: Gastroenterology;  Laterality: N/A;   WISDOM TOOTH EXTRACTION      OB History   No obstetric history on file.      Home Medications    Prior to Admission medications   Medication Sig Start Date End Date Taking? Authorizing Provider  albuterol (PROVENTIL)  (2.5 MG/3ML) 0.083% nebulizer solution Take 3 mLs (2.5 mg total) by nebulization every 6 (six) hours as needed for wheezing or shortness of breath. 06/17/23  Yes Mickie Bail, NP  albuterol (VENTOLIN HFA) 108 (90 Base) MCG/ACT inhaler Inhale 1-2 puffs into the lungs every 6 (six) hours as needed. 06/17/23  Yes Mickie Bail, NP  amoxicillin-clavulanate (AUGMENTIN) 875-125 MG tablet Take 1 tablet by mouth every 12 (twelve) hours. 06/17/23  Yes Mickie Bail, NP  predniSONE (DELTASONE) 10 MG tablet Take 4 tablets (40 mg total) by mouth daily for 5 days. 06/17/23 06/22/23 Yes Mickie Bail, NP  adalimumab (HUMIRA, 2 PEN,) 80 MG/0.8ML pen Inject 1 pen into the skin every 14 (fourteen) days. 02/04/23   Willeen Niece, MD  benzoyl peroxide 10 % LIQD Apply 1 application topically daily. Cleanse body with cleanser once daily    [provider]  cetirizine (ZYRTEC) 10 MG tablet Take 10 mg by mouth every evening.    [provider]  Cholecalciferol (VITAMIN D3 PO) Take 1 tablet by mouth every evening.    [provider]  clindamycin (CLEOCIN T) 1 % external solution Apply to affected areas 1-2 times a day 01/01/22   Willeen Niece, MD  fluconazole (DIFLUCAN) 150 MG tablet Take 1 tablet (150 mg total) by mouth daily. 05/06/22   Willeen Niece, MD  ibuprofen (ADVIL) 800 MG tablet Take 800  mg by mouth 3 (three) times daily as needed for pain. 02/07/21   [provider]  ketoconazole (NIZORAL) 2 % cream Apply under breasts 1-2 times daily as needed. Patient taking differently: Apply 1 application topically 2 (two) times daily as needed (skin rash/irritation.). Apply under breasts 1-2 times daily as needed. 02/13/21   Willeen Niece, MD  liver oil-zinc oxide (DESITIN) 40 % ointment Apply 1 application topically as needed for irritation.    [provider]  metFORMIN (GLUCOPHAGE-XR) 500 MG 24 hr tablet Take 500 mg by mouth 2 (two) times daily. 02/06/21   [provider]   omeprazole (PRILOSEC) 40 MG capsule Take 40 mg by mouth every evening.    [provider]  ondansetron (ZOFRAN ODT) 4 MG disintegrating tablet Take 1 tablet (4 mg total) by mouth every 6 (six) hours as needed for nausea or vomiting. 05/25/21   Ward, Layla Maw, DO  oxyCODONE-acetaminophen (PERCOCET/ROXICET) 5-325 MG tablet Take 2 tablets by mouth every 6 (six) hours as needed. 05/25/21   Ward, Layla Maw, DO  pregabalin (LYRICA) 75 MG capsule Take 75 mg by mouth See admin instructions. Take 1 capsule (75 mg) by mouth up to 3 times daily    [provider]  triamcinolone cream (KENALOG) 0.1 % Apply to affected area itchy rash on leg twice daily until improved. Avoid face, groin, axilla. Patient not taking: No sig reported 02/13/21   Willeen Niece, MD    Family History Family History  Problem Relation Age of Onset   COPD Mother     Social History Social History   Tobacco Use   Smoking status: Every Day    Current packs/day: 0.50    Types: Cigarettes   Smokeless tobacco: Never  Substance Use Topics   Alcohol use: Not Currently   Drug use: Yes    Types: Marijuana     Allergies   Flagyl [metronidazole], Aspirin, Ky plus spermicidal [nonoxynol 9], Meloxicam, Morphine and codeine, Tape, and Tramadol   Review of Systems Review of Systems  Constitutional:  Negative for chills and fever.  HENT:  Positive for congestion, ear pain, postnasal drip and rhinorrhea. Negative for sore throat.   Respiratory:  Positive for cough, shortness of breath and wheezing.   Cardiovascular:  Negative for chest pain and palpitations.     Physical Exam Triage Vital Signs ED Triage Vitals  Encounter Vitals Group     BP      Systolic BP Percentile      Diastolic BP Percentile      Pulse      Resp      Temp      Temp src      SpO2      Weight      Height      Head Circumference      Peak Flow      Pain Score      Pain Loc      Pain Education      Exclude from Growth Chart     No data found.  Updated Vital Signs BP 135/84   Pulse 90   Temp 98.9 F (37.2 C) (Oral)   Resp 18   Wt (!) 350 lb (158.8 kg)   SpO2 96%   BMI 54.82 kg/m   Visual Acuity Right Eye Distance:   Left Eye Distance:   Bilateral Distance:    Right Eye Near:   Left Eye Near:    Bilateral Near:  Physical Exam Constitutional:      General: She is not in acute distress.    Appearance: She is obese.  HENT:     Right Ear: Tympanic membrane normal.     Left Ear: Tympanic membrane normal.     Nose: Congestion and rhinorrhea present.     Mouth/Throat:     Mouth: Mucous membranes are moist.     Pharynx: Oropharynx is clear.  Cardiovascular:     Rate and Rhythm: Normal rate and regular rhythm.     Heart sounds: Normal heart sounds.  Pulmonary:     Effort: Pulmonary effort is normal. No respiratory distress.     Breath sounds: Normal breath sounds. No wheezing.  Skin:    General: Skin is warm and dry.  Neurological:     Mental Status: She is alert.      UC Treatments / Results  Labs (all labs ordered are listed, but only abnormal results are displayed) Labs Reviewed - No data to display  EKG   Radiology No results found.  Procedures Procedures (including critical care time)  Medications Ordered in UC Medications - No data to display  Initial Impression / Assessment and Plan / UC Course  I have reviewed the triage vital signs and the nursing notes.  Pertinent labs & imaging results that were available during my care of the patient were reviewed by me and considered in my medical decision making (see chart for details).    Moderate persistent asthma exacerbation, acute sinusitis.  Afebrile and vital signs are stable.  Lungs are clear at this time and O2 sat is 96% on room air.  Refill provided on albuterol nebulizer solution and albuterol inhaler.  Treating with prednisone and Augmentin.  Instructed patient to follow-up with her PCP tomorrow.  ED precautions  given.  Education provided on asthma and sinus infection.  She agrees to plan of care.  Final Clinical Impressions(s) / UC Diagnoses   Final diagnoses:  Moderate persistent asthma with acute exacerbation  Acute non-recurrent maxillary sinusitis     Discharge Instructions      Use the albuterol inhaler or nebulizer treatment as directed.  Take the prednisone and Augmentin as directed.  Follow up with your primary care provider tomorrow.  Go to the emergency department if you have worsening symptoms.        ED Prescriptions     Medication Sig Dispense Auth. Provider   albuterol (PROVENTIL) (2.5 MG/3ML) 0.083% nebulizer solution Take 3 mLs (2.5 mg total) by nebulization every 6 (six) hours as needed for wheezing or shortness of breath. 75 mL Mickie Bail, NP   albuterol (VENTOLIN HFA) 108 (90 Base) MCG/ACT inhaler Inhale 1-2 puffs into the lungs every 6 (six) hours as needed. 18 g Mickie Bail, NP   predniSONE (DELTASONE) 10 MG tablet Take 4 tablets (40 mg total) by mouth daily for 5 days. 20 tablet Mickie Bail, NP   amoxicillin-clavulanate (AUGMENTIN) 875-125 MG tablet Take 1 tablet by mouth every 12 (twelve) hours. 14 tablet Mickie Bail, NP      PDMP not reviewed this encounter.   Mickie Bail, NP 06/17/23 1734

## 2023-06-29 ENCOUNTER — Ambulatory Visit
Admit: 2023-06-29 | Discharge: 2023-06-30 | Payer: PRIVATE HEALTH INSURANCE | Attending: Hematology & Oncology | Primary: Hematology & Oncology

## 2023-06-29 DIAGNOSIS — Z720 Tobacco use: Principal | ICD-10-CM

## 2023-06-29 DIAGNOSIS — J209 Acute bronchitis, unspecified: Principal | ICD-10-CM

## 2023-06-29 DIAGNOSIS — J45901 Unspecified asthma with (acute) exacerbation: Principal | ICD-10-CM

## 2023-06-29 MED ORDER — LEVOFLOXACIN 500 MG TABLET
ORAL_TABLET | Freq: Every day | ORAL | 0 refills | 10 days | Status: CP
Start: 2023-06-29 — End: ?

## 2023-06-29 MED ORDER — METHYLPREDNISOLONE 4 MG TABLETS IN A DOSE PACK
Freq: Every day | ORAL | 0 refills | 21 days | Status: CP
Start: 2023-06-29 — End: 2024-06-28

## 2023-06-29 MED ORDER — ALBUTEROL SULFATE 2.5 MG/3 ML (0.083 %) SOLUTION FOR NEBULIZATION
Freq: Four times a day (QID) | RESPIRATORY_TRACT | 0 refills | 10 days | Status: CP | PRN
Start: 2023-06-29 — End: 2024-06-28
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
## 2023-07-01 ENCOUNTER — Emergency Department
Admit: 2023-07-01 | Discharge: 2023-07-01 | Disposition: A | Discharge status: Home / Self Care | Payer: PRIVATE HEALTH INSURANCE | Attending: Student in an Organized Health Care Education/Training Program

## 2023-07-01 ENCOUNTER — Ambulatory Visit
Admit: 2023-07-01 | Discharge: 2023-07-01 | Disposition: A | Discharge status: Home / Self Care | Payer: PRIVATE HEALTH INSURANCE | Attending: Student in an Organized Health Care Education/Training Program

## 2023-07-01 DIAGNOSIS — G4733 Obstructive sleep apnea (adult) (pediatric): Principal | ICD-10-CM

## 2023-07-01 DIAGNOSIS — J45901 Unspecified asthma with (acute) exacerbation: Principal | ICD-10-CM

## 2023-07-01 DIAGNOSIS — R079 Chest pain, unspecified: Secondary | ICD-10-CM | POA: Diagnosis not present

## 2023-07-01 DIAGNOSIS — J4521 Mild intermittent asthma with (acute) exacerbation: Secondary | ICD-10-CM | POA: Diagnosis not present

## 2023-07-01 LAB — COMPREHENSIVE METABOLIC PANEL
ALBUMIN: 3.1 g/dL — ABNORMAL LOW (ref 3.4–5.0)
ALKALINE PHOSPHATASE: 105 U/L (ref 46–116)
ALT (SGPT): 13 U/L (ref 10–49)
ANION GAP: 14 mmol/L (ref 5–14)
AST (SGOT): 18 U/L (ref <=34)
BILIRUBIN TOTAL: 0.2 mg/dL — ABNORMAL LOW (ref 0.3–1.2)
BLOOD UREA NITROGEN: 11 mg/dL (ref 9–23)
BUN / CREAT RATIO: 21
CALCIUM: 8.9 mg/dL (ref 8.7–10.4)
CHLORIDE: 106 mmol/L (ref 98–107)
CO2: 22.4 mmol/L (ref 20.0–31.0)
CREATININE: 0.53 mg/dL — ABNORMAL LOW (ref 0.55–1.02)
EGFR CKD-EPI (2021) FEMALE: >90 mL/min/{1.73_m2} (ref >=60)
GLUCOSE RANDOM: 245 mg/dL — ABNORMAL HIGH (ref 70–179)
POTASSIUM: 4.1 mmol/L (ref 3.4–4.8)
PROTEIN TOTAL: 6.7 g/dL (ref 5.7–8.2)
SODIUM: 142 mmol/L (ref 135–145)

## 2023-07-01 LAB — CBC W/ AUTO DIFF
BASOPHILS ABSOLUTE COUNT: 0.1 10*9/L (ref 0.0–0.1)
BASOPHILS RELATIVE PERCENT: 0.9 %
EOSINOPHILS ABSOLUTE COUNT: 0.1 10*9/L (ref 0.0–0.5)
EOSINOPHILS RELATIVE PERCENT: 0.9 %
HEMATOCRIT: 40.6 % (ref 34.0–44.0)
HEMOGLOBIN: 13.3 g/dL (ref 11.3–14.9)
LYMPHOCYTES ABSOLUTE COUNT: 4.1 10*9/L — ABNORMAL HIGH (ref 1.1–3.6)
LYMPHOCYTES RELATIVE PERCENT: 29.1 %
MEAN CORPUSCULAR HEMOGLOBIN CONC: 32.8 g/dL (ref 32.0–36.0)
MEAN CORPUSCULAR HEMOGLOBIN: 29.3 pg (ref 25.9–32.4)
MEAN CORPUSCULAR VOLUME: 89.2 fL (ref 77.6–95.7)
MEAN PLATELET VOLUME: 9.3 fL (ref 6.8–10.7)
MONOCYTES ABSOLUTE COUNT: 0.9 10*9/L — ABNORMAL HIGH (ref 0.3–0.8)
MONOCYTES RELATIVE PERCENT: 6.5 %
NEUTROPHILS ABSOLUTE COUNT: 8.8 10*9/L — ABNORMAL HIGH (ref 1.8–7.8)
NEUTROPHILS RELATIVE PERCENT: 62.6 %
NUCLEATED RED BLOOD CELLS: 0 /100{WBCs} (ref <=4)
PLATELET COUNT: 265 10*9/L (ref 150–450)
RED BLOOD CELL COUNT: 4.55 10*12/L (ref 3.95–5.13)
RED CELL DISTRIBUTION WIDTH: 15.2 % (ref 12.2–15.2)
WBC ADJUSTED: 14 10*9/L — ABNORMAL HIGH (ref 3.6–11.2)

## 2023-07-01 LAB — HIGH SENSITIVITY TROPONIN I - SINGLE: HIGH SENSITIVITY TROPONIN I: 9 ng/L (ref <=34)

## 2023-07-01 LAB — PRO-BNP: PRO-BNP: 164 pg/mL (ref <=300.0)

## 2023-07-01 MED ADMIN — ipratropium-albuterol (DUO-NEB) 0.5-2.5 mg/3 mL nebulizer solution 3 mL: 3 mL | RESPIRATORY_TRACT | @ 14:00:00 | Stop: 2023-07-01

## 2023-07-01 NOTE — Unmapped (Signed)
Patient presents w/complaints of sinus infection that turned into bronchitis.   States that the cough has persisted and now having SOB.  Last night patient reports O2 dropping to 50's.  Notes that the SOB worsens while lying down.
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
## 2023-07-03 NOTE — Unmapped (Signed)
Complex Case Management  SUMMARY NOTE    Attempted to contact pt today at Cell number to introduce Complex Case Management services. Left message to return call.; 1st attempt    Discuss at next visit: Introduction to Complex Case Management      Adrian Prince - Case Manager   [facility] Clinical Services  [address]  [place], [place] [address]  p. [phone]   [email or web address]

## 2023-07-10 ENCOUNTER — Other Ambulatory Visit: Payer: Self-pay | Admitting: Dermatology

## 2023-07-10 DIAGNOSIS — B379 Candidiasis, unspecified: Secondary | ICD-10-CM

## 2023-07-12 ENCOUNTER — Emergency Department
Admit: 2023-07-12 | Discharge: 2023-07-13 | Disposition: A | Discharge status: Home / Self Care | Payer: PRIVATE HEALTH INSURANCE

## 2023-07-12 ENCOUNTER — Ambulatory Visit
Admit: 2023-07-12 | Discharge: 2023-07-13 | Disposition: A | Discharge status: Home / Self Care | Payer: PRIVATE HEALTH INSURANCE

## 2023-07-12 DIAGNOSIS — S39012A Strain of muscle, fascia and tendon of lower back, initial encounter: Principal | ICD-10-CM

## 2023-07-12 DIAGNOSIS — R0789 Other chest pain: Principal | ICD-10-CM

## 2023-07-12 DIAGNOSIS — S29009A Unspecified injury of muscle and tendon of unspecified wall of thorax, initial encounter: Secondary | ICD-10-CM | POA: Diagnosis not present

## 2023-07-12 DIAGNOSIS — R0989 Other specified symptoms and signs involving the circulatory and respiratory systems: Secondary | ICD-10-CM | POA: Diagnosis not present

## 2023-07-12 MED ORDER — FLUCONAZOLE 150 MG TABLET
ORAL_TABLET | Freq: Once | ORAL | 0 refills | 1 days | Status: CP
Start: 2023-07-12 — End: 2023-07-12

## 2023-07-13 ENCOUNTER — Ambulatory Visit: Admit: 2023-07-13 | Discharge: 2023-07-14 | Payer: PRIVATE HEALTH INSURANCE

## 2023-07-13 DIAGNOSIS — K219 Gastro-esophageal reflux disease without esophagitis: Principal | ICD-10-CM

## 2023-07-13 DIAGNOSIS — E1169 Type 2 diabetes mellitus with other specified complication: Principal | ICD-10-CM

## 2023-07-13 DIAGNOSIS — M25552 Pain in left hip: Principal | ICD-10-CM

## 2023-07-13 DIAGNOSIS — Z1231 Encounter for screening mammogram for malignant neoplasm of breast: Principal | ICD-10-CM

## 2023-07-13 DIAGNOSIS — N393 Stress incontinence (female) (male): Principal | ICD-10-CM

## 2023-07-13 DIAGNOSIS — G8929 Other chronic pain: Principal | ICD-10-CM

## 2023-07-13 DIAGNOSIS — L732 Hidradenitis suppurativa: Principal | ICD-10-CM

## 2023-07-13 DIAGNOSIS — Z720 Tobacco use: Principal | ICD-10-CM

## 2023-07-13 DIAGNOSIS — B379 Candidiasis, unspecified: Principal | ICD-10-CM

## 2023-07-13 DIAGNOSIS — J302 Other seasonal allergic rhinitis: Principal | ICD-10-CM

## 2023-07-13 DIAGNOSIS — J45909 Unspecified asthma, uncomplicated: Principal | ICD-10-CM

## 2023-07-13 DIAGNOSIS — M5440 Lumbago with sciatica, unspecified side: Principal | ICD-10-CM

## 2023-07-13 DIAGNOSIS — Z23 Encounter for immunization: Secondary | ICD-10-CM | POA: Diagnosis not present

## 2023-07-13 DIAGNOSIS — E119 Type 2 diabetes mellitus without complications: Secondary | ICD-10-CM | POA: Diagnosis not present

## 2023-07-13 MED ORDER — CETIRIZINE 10 MG TABLET
ORAL_TABLET | Freq: Every day | ORAL | 3 refills | 90 days | Status: CP
Start: 2023-07-13 — End: ?

## 2023-07-13 MED ORDER — BUDESONIDE-FORMOTEROL HFA 160 MCG-4.5 MCG/ACTUATION AEROSOL INHALER
Freq: Two times a day (BID) | RESPIRATORY_TRACT | 1 refills | 92 days | Status: CP
Start: 2023-07-13 — End: 2024-07-12

## 2023-07-13 MED ORDER — FLUCONAZOLE 150 MG TABLET
ORAL_TABLET | Freq: Once | ORAL | 0 refills | 1 days | Status: CP
Start: 2023-07-13 — End: 2023-07-13

## 2023-07-13 MED ORDER — OMEPRAZOLE 40 MG CAPSULE,DELAYED RELEASE
ORAL_CAPSULE | Freq: Every day | ORAL | 1 refills | 90 days | Status: CP
Start: 2023-07-13 — End: 2024-07-12
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
## 2023-07-13 NOTE — Unmapped (Signed)
Complex Case Management  SUMMARY NOTE    Complex Case Manager spoke with patient and verified correct patient using two identifiers today to introduce the Complex Case Management program.     Discussed the following:  Program Services    Program status: Interested    Discuss at next visit: Introduction to Complex Case Management    Scheduled for: 4 pm with Complex Case Manager      Adrian Prince - Case Manager   [facility]  [address]  [place], [place] [address]  p. [phone]   [email or web address]

## 2023-07-13 NOTE — Unmapped (Signed)
 Please call [phone] to schedule your imaging study.    Mammogram: please call [phone].    [facility]  [address]  [place], [place] [address]      [facility]  [address]  [place], [place] [address]

## 2023-07-14 MED FILL — OZEMPIC 0.25 MG OR 0.5 MG (2 MG/3 ML) SUBCUTANEOUS PEN INJECTOR: SUBCUTANEOUS | 56 days supply | Qty: 3 | Fill #0

## 2023-07-14 NOTE — Unmapped (Signed)
Complex Case Management  SUMMARY NOTE    Attempted to contact pt today at Cell number to introduce Complex Case Management services. Left message to return call.; 2nd attempt, letter sent.    Discuss at next visit: No answer, letter sent       Quantavia Frith,RN - Case Manager   [facility]  [address],  [place], [place] [address]  p. [phone]   [email or web address]

## 2023-07-15 DIAGNOSIS — J45909 Unspecified asthma, uncomplicated: Principal | ICD-10-CM

## 2023-07-15 MED ORDER — SYMBICORT 160 MCG-4.5 MCG/ACTUATION HFA AEROSOL INHALER
Freq: Two times a day (BID) | RESPIRATORY_TRACT | 1 refills | 92 days | Status: CP
Start: 2023-07-15 — End: 2024-07-14
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
## 2023-10-23 DIAGNOSIS — E1169 Type 2 diabetes mellitus with other specified complication: Principal | ICD-10-CM

## 2023-10-23 DIAGNOSIS — Z5941 Food insecurity: Secondary | ICD-10-CM | POA: Diagnosis not present

## 2023-10-23 DIAGNOSIS — J45901 Unspecified asthma with (acute) exacerbation: Secondary | ICD-10-CM | POA: Diagnosis not present

## 2023-10-23 DIAGNOSIS — J019 Acute sinusitis, unspecified: Secondary | ICD-10-CM | POA: Diagnosis not present

## 2023-10-23 DIAGNOSIS — B9689 Other specified bacterial agents as the cause of diseases classified elsewhere: Secondary | ICD-10-CM | POA: Diagnosis not present

## 2023-10-23 DIAGNOSIS — Z599 Problem related to housing and economic circumstances, unspecified: Secondary | ICD-10-CM | POA: Diagnosis not present

## 2023-10-23 DIAGNOSIS — Z5989 Other problems related to housing and economic circumstances: Secondary | ICD-10-CM | POA: Diagnosis not present

## 2023-10-23 DIAGNOSIS — Z760 Encounter for issue of repeat prescription: Secondary | ICD-10-CM | POA: Diagnosis not present

## 2023-10-23 DIAGNOSIS — Z03818 Encounter for observation for suspected exposure to other biological agents ruled out: Secondary | ICD-10-CM | POA: Diagnosis not present

## 2023-10-23 MED ORDER — OZEMPIC 0.25 MG OR 0.5 MG (2 MG/3 ML) SUBCUTANEOUS PEN INJECTOR
SUBCUTANEOUS | 0 refills | 56.00 days | Status: CP
Start: 2023-10-23 — End: 2024-10-22
  Filled 2023-12-31: qty 3, 56d supply, fill #0

## 2023-10-23 NOTE — Unmapped (Signed)
 Patient is requesting the following refill  Requested Prescriptions     Pending Prescriptions Disp Refills    semaglutide (OZEMPIC) 0.25 mg or 0.5 mg (2 mg/3 mL) PnIj 3 mL 0     Sig: Inject 0.25 mg under the skin once a week.       Recent Visits  Date Type Provider Dept   07/13/23 Office Visit Deneise Lever, FNP [facility] [address] [facility]   06/29/23 Office Visit Tessie Eke, MD [facility] [address] [facility]   02/17/23 Office Visit Johnn Hai, Loleta Rose, FNP [facility] [address] [facility]   01/29/23 Office Visit Clapp, Magnus Ivan, FNP [facility] [address] [facility]   Showing recent visits within past 365 days and meeting all other requirements  Future Appointments  Date Type Provider Dept   10/29/23 Appointment Deneise Lever, FNP [facility] [address] [facility]   Showing future appointments within next 365 days and meeting all other requirements       Labs: A1c:   HGB A1C, POC (%)   Date Value   07/27/2019 6.0     Hemoglobin A1C (%)   Date Value   07/13/2023 7.4 (A)

## 2023-10-29 ENCOUNTER — Ambulatory Visit: Admit: 2023-10-29 | Payer: PRIVATE HEALTH INSURANCE

## 2023-10-29 ENCOUNTER — Emergency Department
Admission: EM | Admit: 2023-10-29 | Discharge: 2023-10-29 | Disposition: A | Discharge status: Home / Self Care | Attending: Emergency Medicine | Admitting: Emergency Medicine

## 2023-10-29 ENCOUNTER — Encounter: Payer: Self-pay | Admitting: Emergency Medicine

## 2023-10-29 ENCOUNTER — Other Ambulatory Visit: Payer: Self-pay

## 2023-10-29 DIAGNOSIS — G43009 Migraine without aura, not intractable, without status migrainosus: Secondary | ICD-10-CM | POA: Insufficient documentation

## 2023-10-29 DIAGNOSIS — R519 Headache, unspecified: Secondary | ICD-10-CM | POA: Diagnosis present

## 2023-10-29 LAB — URINALYSIS, ROUTINE W REFLEX MICROSCOPIC
Bilirubin Urine: NEGATIVE
Glucose, UA: NEGATIVE mg/dL
Hgb urine dipstick: NEGATIVE
Ketones, ur: NEGATIVE mg/dL
Leukocytes,Ua: NEGATIVE
Nitrite: NEGATIVE
Protein, ur: NEGATIVE mg/dL
Specific Gravity, Urine: 1.025 (ref 1.005–1.030)
pH: 5 (ref 5.0–8.0)

## 2023-10-29 LAB — CBC WITH DIFFERENTIAL/PLATELET
Abs Immature Granulocytes: 0.03 10*3/uL (ref 0.00–0.07)
Basophils Absolute: 0.1 10*3/uL (ref 0.0–0.1)
Basophils Relative: 0 %
Eosinophils Absolute: 0.3 10*3/uL (ref 0.0–0.5)
Eosinophils Relative: 2 %
HCT: 40.3 % (ref 36.0–46.0)
Hemoglobin: 13.1 g/dL (ref 12.0–15.0)
Immature Granulocytes: 0 %
Lymphocytes Relative: 24 %
Lymphs Abs: 2.7 10*3/uL (ref 0.7–4.0)
MCH: 29.2 pg (ref 26.0–34.0)
MCHC: 32.5 g/dL (ref 30.0–36.0)
MCV: 90 fL (ref 80.0–100.0)
Monocytes Absolute: 0.6 10*3/uL (ref 0.1–1.0)
Monocytes Relative: 5 %
Neutro Abs: 7.9 10*3/uL — ABNORMAL HIGH (ref 1.7–7.7)
Neutrophils Relative %: 69 %
Platelets: 307 10*3/uL (ref 150–400)
RBC: 4.48 MIL/uL (ref 3.87–5.11)
RDW: 14.5 % (ref 11.5–15.5)
WBC: 11.5 10*3/uL — ABNORMAL HIGH (ref 4.0–10.5)
nRBC: 0 % (ref 0.0–0.2)

## 2023-10-29 LAB — BASIC METABOLIC PANEL
Anion gap: 5 (ref 5–15)
BUN: 12 mg/dL (ref 6–20)
CO2: 26 mmol/L (ref 22–32)
Calcium: 8.6 mg/dL — ABNORMAL LOW (ref 8.9–10.3)
Chloride: 105 mmol/L (ref 98–111)
Creatinine, Ser: 0.67 mg/dL (ref 0.44–1.00)
GFR, Estimated: >60 mL/min (ref >60)
Glucose, Bld: 226 mg/dL — ABNORMAL HIGH (ref 70–99)
Potassium: 3.8 mmol/L (ref 3.5–5.1)
Sodium: 136 mmol/L (ref 135–145)

## 2023-10-29 LAB — RESP PANEL BY RT-PCR (RSV, FLU A&B, COVID)  RVPGX2
Influenza A by PCR: NEGATIVE
Influenza B by PCR: NEGATIVE
Resp Syncytial Virus by PCR: NEGATIVE
SARS Coronavirus 2 by RT PCR: NEGATIVE

## 2023-10-29 MED ORDER — PROCHLORPERAZINE EDISYLATE 10 MG/2ML IJ SOLN
5.0000 mg | Freq: Once | INTRAMUSCULAR | Status: AC
  Administered 2023-10-29: 5 mg via INTRAVENOUS
  Filled 2023-10-29: qty 2

## 2023-10-29 MED ORDER — KETOROLAC TROMETHAMINE 15 MG/ML IJ SOLN
15.0000 mg | Freq: Once | INTRAMUSCULAR | Status: AC
  Administered 2023-10-29: 15 mg via INTRAVENOUS
  Filled 2023-10-29: qty 1

## 2023-10-29 MED ORDER — DIPHENHYDRAMINE HCL 50 MG/ML IJ SOLN
25.0000 mg | Freq: Once | INTRAMUSCULAR | Status: AC
  Administered 2023-10-29: 25 mg via INTRAVENOUS
  Filled 2023-10-29: qty 1

## 2023-10-29 MED ORDER — SODIUM CHLORIDE 0.9 % IV BOLUS
1000.0000 mL | Freq: Once | INTRAVENOUS | Status: AC
Start: 2023-10-29 — End: 2023-10-29
  Administered 2023-10-29: 1000 mL via INTRAVENOUS

## 2023-10-29 NOTE — ED Triage Notes (Signed)
 Pt via POV from home. Pt c/o headache for the past 7-8 days, pt has a hx of migraine but states she hasn't had one in a while. Pt also c/o bilateral knee pain this AM. Denies injury. Pt is A&Ox4 and NAD

## 2023-10-29 NOTE — Discharge Instructions (Signed)
 Please follow-up with your primary care provider as needed.  Your laboratory workup otherwise is reassuring today.  Negative for COVID, flu, RSV.  No evidence of a UTI.  Please maintain hydration at home.

## 2023-10-29 NOTE — ED Notes (Signed)
 See triage notes. Patient c/o headache/migraine for the past seven to eight days. Patient was exposed to someone with the flu.

## 2023-10-29 NOTE — ED Provider Notes (Signed)
 [facility] Provider Note    Event Date/Time   First MD Initiated Contact with Patient 10/29/23 [phone]     (approximate)   History   Headache   HPI Teresa Malone is a 43 y.o. female presenting today for headache.  Patient states over the past week she has had intermittent migraine headaches.  These seem to improve with Excedrin but did not completely resolve.  She has also had cough and congestion and family ember sick at home with similar symptoms.  Noting body aches.  Mild nausea but no vomiting.  Denies any chest pain, difficulty breathing.  Intermittent dysuria.  Recently seen 6 days ago at [facility] clinic and diagnosed with bacterial sinusitis.  Placed on Augmentin and prednisone.     Physical Exam   Triage Vital Signs: ED Triage Vitals  Encounter Vitals Group     BP      Systolic BP Percentile      Diastolic BP Percentile      Pulse      Resp      Temp      Temp src      SpO2      Weight      Height      Head Circumference      Peak Flow      Pain Score      Pain Loc      Pain Education      Exclude from Growth Chart     Most recent vital signs: Vitals:   10/29/23 0928  BP: 118/70  Pulse: 84  Resp: 20  Temp: 98 F (36.7 C)  SpO2: 94%    Physical Exam: I have reviewed the vital signs and nursing notes. General: Awake, alert, no acute distress.  Nontoxic appearing. Head:  Atraumatic, normocephalic.   ENT:  EOM intact, PERRL. Oral mucosa is pink and moist with no lesions. Neck: Neck is supple with full range of motion, No meningeal signs. Cardiovascular:  RRR, No murmurs. Peripheral pulses palpable and equal bilaterally. Respiratory:  Symmetrical chest wall expansion.  No rhonchi, rales, or wheezes.  Good air movement throughout.  No use of accessory muscles.   Musculoskeletal:  No cyanosis or edema. Moving extremities with full ROM Abdomen:  Soft, nontender, nondistended. Neuro:  GCS 15, moving all four extremities,  interacting appropriately. Speech clear. Psych:  Calm, appropriate.   Skin:  Warm, dry, no rash.    ED Results / Procedures / Treatments   Labs (all labs ordered are listed, but only abnormal results are displayed) Labs Reviewed  BASIC METABOLIC PANEL - Abnormal; Notable for the following components:      Result Value   Glucose, Bld 226 (*)    Calcium 8.6 (*)    All other components within normal limits  CBC WITH DIFFERENTIAL/PLATELET - Abnormal; Notable for the following components:   WBC 11.5 (*)    Neutro Abs 7.9 (*)    All other components within normal limits  URINALYSIS, ROUTINE W REFLEX MICROSCOPIC - Abnormal; Notable for the following components:   Color, Urine YELLOW (*)    APPearance HAZY (*)    All other components within normal limits  RESP PANEL BY RT-PCR (RSV, FLU A&B, COVID)  [id number]  POC URINE PREG, ED     EKG    RADIOLOGY    PROCEDURES:  Critical Care performed: No  Procedures   MEDICATIONS ORDERED IN ED: Medications  sodium chloride 0.9 % bolus 1,000 mL (  1,000 mLs Intravenous New Bag/Given 10/29/23 0948)  ketorolac (TORADOL) 15 MG/ML injection 15 mg (15 mg Intravenous Given 10/29/23 0949)  prochlorperazine (COMPAZINE) injection 5 mg (5 mg Intravenous Given 10/29/23 0948)  diphenhydrAMINE (BENADRYL) injection 25 mg (25 mg Intravenous Given 10/29/23 0950)     IMPRESSION / MDM / ASSESSMENT AND PLAN / ED COURSE  I reviewed the triage vital signs and the nursing notes.                              Differential diagnosis includes, but is not limited to, COVID, flu, RSV, migraine, viral sinusitis  Patient's presentation is most consistent with acute complicated illness / injury requiring diagnostic workup.  Patient is a 43 year old female with history of migraines presenting today for the same.  She appears to have viral symptoms with occasional congestion, body aches, and headache.  Will get basic laboratory workup and migraine cocktail for  treatment.  Will also check respiratory swab and urine.  Negative for COVID, flu, RSV.  Urine with no sign of infection.  Patient feeling better and resting comfortably after migraine cocktail.  Will discharge with follow-up with PCP and given strict return precautions.     FINAL CLINICAL IMPRESSION(S) / ED DIAGNOSES   Final diagnoses:  Migraine without aura and without status migrainosus, not intractable     Rx / DC Orders   ED Discharge Orders     None        Note:  This document was prepared using Dragon voice recognition software and may include unintentional dictation errors.   Janith Lima, MD 10/29/23 [phone]

## 2023-12-12 ENCOUNTER — Emergency Department
Admit: 2023-12-12 | Discharge: 2023-12-12 | Disposition: A | Discharge status: Home / Self Care | Payer: Medicaid (Managed Care) | Attending: Student in an Organized Health Care Education/Training Program

## 2023-12-12 DIAGNOSIS — S8010XA Contusion of unspecified lower leg, initial encounter: Principal | ICD-10-CM

## 2023-12-12 DIAGNOSIS — S8000XA Contusion of unspecified knee, initial encounter: Principal | ICD-10-CM

## 2023-12-12 DIAGNOSIS — M1990 Unspecified osteoarthritis, unspecified site: Secondary | ICD-10-CM | POA: Diagnosis not present

## 2023-12-12 DIAGNOSIS — E785 Hyperlipidemia, unspecified: Secondary | ICD-10-CM | POA: Diagnosis not present

## 2023-12-12 DIAGNOSIS — Z7985 Long-term (current) use of injectable non-insulin antidiabetic drugs: Secondary | ICD-10-CM | POA: Diagnosis not present

## 2023-12-12 DIAGNOSIS — E119 Type 2 diabetes mellitus without complications: Secondary | ICD-10-CM | POA: Diagnosis not present

## 2023-12-12 DIAGNOSIS — M25462 Effusion, left knee: Secondary | ICD-10-CM | POA: Diagnosis not present

## 2023-12-12 DIAGNOSIS — Z041 Encounter for examination and observation following transport accident: Secondary | ICD-10-CM | POA: Diagnosis not present

## 2023-12-12 DIAGNOSIS — Z885 Allergy status to narcotic agent status: Secondary | ICD-10-CM | POA: Diagnosis not present

## 2023-12-12 DIAGNOSIS — Z79899 Other long term (current) drug therapy: Secondary | ICD-10-CM | POA: Diagnosis not present

## 2023-12-12 DIAGNOSIS — M7989 Other specified soft tissue disorders: Secondary | ICD-10-CM | POA: Diagnosis not present

## 2023-12-12 DIAGNOSIS — F419 Anxiety disorder, unspecified: Secondary | ICD-10-CM | POA: Diagnosis not present

## 2023-12-12 DIAGNOSIS — Z886 Allergy status to analgesic agent status: Secondary | ICD-10-CM | POA: Diagnosis not present

## 2023-12-12 DIAGNOSIS — E1142 Type 2 diabetes mellitus with diabetic polyneuropathy: Secondary | ICD-10-CM | POA: Diagnosis not present

## 2023-12-12 DIAGNOSIS — R0902 Hypoxemia: Secondary | ICD-10-CM | POA: Diagnosis not present

## 2023-12-12 DIAGNOSIS — M79661 Pain in right lower leg: Secondary | ICD-10-CM | POA: Diagnosis not present

## 2023-12-12 DIAGNOSIS — M25561 Pain in right knee: Secondary | ICD-10-CM | POA: Diagnosis not present

## 2023-12-12 DIAGNOSIS — R0689 Other abnormalities of breathing: Secondary | ICD-10-CM | POA: Diagnosis not present

## 2023-12-12 DIAGNOSIS — R000 Tachycardia, unspecified: Secondary | ICD-10-CM | POA: Diagnosis not present

## 2023-12-12 DIAGNOSIS — R55 Syncope and collapse: Secondary | ICD-10-CM | POA: Diagnosis not present

## 2023-12-12 MED ADMIN — acetaminophen (TYLENOL) tablet 1,000 mg: 1000 mg | ORAL | @ 15:00:00 | Stop: 2023-12-12
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
## 2023-12-22 DIAGNOSIS — B379 Candidiasis, unspecified: Principal | ICD-10-CM

## 2023-12-22 MED ORDER — FLUCONAZOLE 150 MG TABLET
ORAL_TABLET | Freq: Once | ORAL | 0 refills | 1.00000 days | Status: CP
Start: 2023-12-22 — End: 2023-12-30
  Filled 2023-12-31: qty 1, 1d supply, fill #0

## 2023-12-22 NOTE — Unmapped (Signed)
 Patient is requesting the following refill  Requested Prescriptions     Pending Prescriptions Disp Refills    fluconazole (DIFLUCAN) 150 MG tablet [Pharmacy Med Name: FLUCONAZOLE 150 MG TABLET] 1 tablet 0     Sig: TAKE 1 TABLET (150 MG TOTAL) BY MOUTH ONCE FOR 1 DOSE.       Recent Visits  Date Type Provider Dept   07/13/23 Office Visit Garrett Kallman, FNP [facility] [address] [facility]   06/29/23 Office Visit Sonna Dus, MD [facility] [address] [facility]   02/17/23 Office Visit Albin Huh, Leellen Puller, FNP [facility] [address] [facility]   01/29/23 Office Visit Clapp, Adelbert Adler, FNP [facility] [address] [facility]   Showing recent visits within past 365 days and meeting all other requirements  Future Appointments  No visits were found meeting these conditions.  Showing future appointments within next 365 days and meeting all other requirements       Labs: Not applicable this refill
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
## 2023-12-22 NOTE — Unmapped (Signed)
 Mobile number disconnected. Left message on home number for patient to call back and schedule. Please transfer to [phone]. Amyers

## 2023-12-24 NOTE — Unmapped (Signed)
 Copied from CRM [id number]. Topic: Access To Clinicians - Req Clinic Call Back  >> Dec 24, 2023  2:58 PM Bambi Bonine wrote:  Reason of the Call: patient was needed to be scheduled for her ER follow up. Nothing available to schedule. Please assist.      The patient preferred contact: [phone]  Urgent callback turnaround time: within 24 business hours. Programmer, systems Notified)

## 2023-12-30 NOTE — Unmapped (Signed)
 Copied from CRM #[id number]. Topic: Access To Clinicians - Req Clinic Call Back  >> Dec 30, 2023  3:43 PM Teretha Ferguson wrote:  Needs orders to state how many (mg )and how often to take: semaglutide (OZEMPIC) 0.25 mg or 0.5 mg  and omeprazole (PRILOSEC) 40 MG capsule, the albuterol needs to state that she needs to have it w/ her while she is in the treatment facility. Can be faxed to # [phone], [employer]

## 2023-12-30 NOTE — Unmapped (Signed)
 Called [employer] and spoke with Burdette Carolin who verified that patient is currently at facility. Case manager left for the day provided Burdette Carolin with our fax number to send any requests facility may have.

## 2023-12-31 MED FILL — VARENICLINE TARTRATE 0.5 MG (11)-1 MG (42) TABLETS IN A DOSE PACK: ORAL | 28 days supply | Qty: 53 | Fill #0

## 2023-12-31 MED FILL — SYMBICORT 160 MCG-4.5 MCG/ACTUATION HFA AEROSOL INHALER: RESPIRATORY_TRACT | 90 days supply | Qty: 30.6 | Fill #0

## 2023-12-31 MED FILL — ACCU-CHEK GUIDE GLUCOSE METER: 30 days supply | Qty: 1 | Fill #0

## 2023-12-31 MED FILL — CETIRIZINE 10 MG TABLET: ORAL | 30 days supply | Qty: 30 | Fill #0

## 2023-12-31 MED FILL — ACCU-CHEK GUIDE TEST STRIPS: 34 days supply | Qty: 100 | Fill #0

## 2023-12-31 MED FILL — ACCU-CHEK FASTCLIX LANCET DRUM: 34 days supply | Qty: 102 | Fill #0

## 2023-12-31 MED FILL — ALBUTEROL SULFATE 2.5 MG/3 ML (0.083 %) SOLUTION FOR NEBULIZATION: RESPIRATORY_TRACT | 38 days supply | Qty: 450 | Fill #0

## 2023-12-31 MED FILL — OMEPRAZOLE 40 MG CAPSULE,DELAYED RELEASE: ORAL | 90 days supply | Qty: 90 | Fill #0

## 2024-01-01 NOTE — Unmapped (Signed)
 Copied from CRM #[id number]. Topic: Access To Clinicians - Req Clinic Call Back  >> Jan 01, 2024  1:32 PM [place] N wrote:  Clinical Advice:     Patient needs all of her medication orders sent over to the [employer] that they got the orders but they were not signed, they need to be signed and sent back over to FAX [phone].    The patient preferred contact: Cell Phone Urgent callback turnaround time: within 24 business hours. Programmer, systems Notified)

## 2024-01-05 ENCOUNTER — Ambulatory Visit: Admit: 2024-01-05 | Discharge: 2024-01-06 | Payer: Medicaid (Managed Care)

## 2024-01-05 ENCOUNTER — Encounter: Admit: 2024-01-05 | Discharge: 2024-01-06 | Payer: Medicaid (Managed Care) | Attending: Family | Primary: Family

## 2024-01-05 DIAGNOSIS — Z7151 Drug abuse counseling and surveillance of drug abuser: Principal | ICD-10-CM

## 2024-01-05 DIAGNOSIS — A159 Respiratory tuberculosis unspecified: Principal | ICD-10-CM

## 2024-01-05 DIAGNOSIS — J45901 Unspecified asthma with (acute) exacerbation: Secondary | ICD-10-CM | POA: Diagnosis not present

## 2024-01-05 DIAGNOSIS — Z720 Tobacco use: Secondary | ICD-10-CM | POA: Diagnosis not present

## 2024-01-05 DIAGNOSIS — K219 Gastro-esophageal reflux disease without esophagitis: Secondary | ICD-10-CM | POA: Diagnosis not present

## 2024-01-05 DIAGNOSIS — J302 Other seasonal allergic rhinitis: Secondary | ICD-10-CM | POA: Diagnosis not present

## 2024-01-05 DIAGNOSIS — N390 Urinary tract infection, site not specified: Secondary | ICD-10-CM | POA: Diagnosis not present

## 2024-01-05 DIAGNOSIS — L732 Hidradenitis suppurativa: Secondary | ICD-10-CM | POA: Diagnosis not present

## 2024-01-05 DIAGNOSIS — J45909 Unspecified asthma, uncomplicated: Secondary | ICD-10-CM | POA: Diagnosis not present

## 2024-01-05 DIAGNOSIS — E1169 Type 2 diabetes mellitus with other specified complication: Secondary | ICD-10-CM | POA: Diagnosis not present

## 2024-01-05 DIAGNOSIS — R300 Dysuria: Secondary | ICD-10-CM | POA: Diagnosis not present

## 2024-01-05 DIAGNOSIS — J209 Acute bronchitis, unspecified: Secondary | ICD-10-CM | POA: Diagnosis not present

## 2024-01-05 LAB — HEPATITIS PANEL, ACUTE
HEPATITIS A IGM ANTIBODY: NONREACTIVE
HEPATITIS B CORE IGM ANTIBODY: NONREACTIVE
HEPATITIS B SURFACE ANTIGEN: NONREACTIVE
HEPATITIS C ANTIBODY: NONREACTIVE

## 2024-01-05 LAB — SYPHILIS SCREEN: SYPHILIS AB IGG/IGM SCREEN: NONREACTIVE

## 2024-01-05 LAB — HIV ANTIGEN/ANTIBODY COMBO: HIV ANTIGEN/ANTIBODY COMBO: NONREACTIVE

## 2024-01-08 LAB — QUANTIFERON TB GOLD PLUS
QUANTIFERON ANTIGEN 1 MINUS NIL: 0.02 [IU]/mL
QUANTIFERON ANTIGEN 2 MINUS NIL: 0.01 [IU]/mL
QUANTIFERON MITOGEN: 9.93 [IU]/mL
QUANTIFERON TB GOLD PLUS: NEGATIVE
QUANTIFERON TB NIL VALUE: 0.07 [IU]/mL

## 2024-01-08 LAB — TB AG1: TB AG1 VALUE: 0.09

## 2024-01-08 LAB — TB AG2: TB AG2 VALUE: 0.08

## 2024-01-08 LAB — TB MITOGEN: TB MITOGEN VALUE: 10

## 2024-01-08 LAB — TB NIL: TB NIL VALUE: 0.07

## 2024-01-15 MED FILL — ACCU-CHEK SOFTCLIX LANCETS: 50 days supply | Qty: 100 | Fill #1

## 2024-01-27 ENCOUNTER — Ambulatory Visit: Admit: 2024-01-27 | Discharge: 2024-01-27 | Payer: Medicaid (Managed Care)

## 2024-01-27 ENCOUNTER — Encounter: Admit: 2024-01-27 | Discharge: 2024-01-27 | Payer: Medicaid (Managed Care)

## 2024-01-27 DIAGNOSIS — Z7151 Drug abuse counseling and surveillance of drug abuser: Principal | ICD-10-CM

## 2024-01-27 DIAGNOSIS — Z1272 Encounter for screening for malignant neoplasm of vagina: Secondary | ICD-10-CM | POA: Diagnosis not present

## 2024-01-27 DIAGNOSIS — N939 Abnormal uterine and vaginal bleeding, unspecified: Secondary | ICD-10-CM | POA: Diagnosis not present

## 2024-01-27 DIAGNOSIS — R102 Pelvic and perineal pain: Secondary | ICD-10-CM | POA: Diagnosis not present

## 2024-01-27 DIAGNOSIS — Z01419 Encounter for gynecological examination (general) (routine) without abnormal findings: Secondary | ICD-10-CM | POA: Diagnosis not present

## 2024-01-27 DIAGNOSIS — Z1151 Encounter for screening for human papillomavirus (HPV): Secondary | ICD-10-CM | POA: Diagnosis not present

## 2024-01-27 LAB — SYPHILIS SCREEN: SYPHILIS AB IGG/IGM SCREEN: NONREACTIVE

## 2024-02-03 DIAGNOSIS — L608 Other nail disorders: Secondary | ICD-10-CM | POA: Diagnosis not present

## 2024-02-03 DIAGNOSIS — Z889 Allergy status to unspecified drugs, medicaments and biological substances status: Secondary | ICD-10-CM | POA: Diagnosis not present

## 2024-02-03 DIAGNOSIS — A5901 Trichomonal vulvovaginitis: Secondary | ICD-10-CM | POA: Diagnosis not present

## 2024-02-03 DIAGNOSIS — Z713 Dietary counseling and surveillance: Secondary | ICD-10-CM | POA: Diagnosis not present

## 2024-02-03 DIAGNOSIS — M79672 Pain in left foot: Secondary | ICD-10-CM | POA: Diagnosis not present

## 2024-02-03 DIAGNOSIS — L732 Hidradenitis suppurativa: Secondary | ICD-10-CM | POA: Diagnosis not present

## 2024-02-03 DIAGNOSIS — M79671 Pain in right foot: Secondary | ICD-10-CM | POA: Diagnosis not present

## 2024-02-03 DIAGNOSIS — Z6841 Body Mass Index (BMI) 40.0 and over, adult: Secondary | ICD-10-CM | POA: Diagnosis not present

## 2024-02-03 DIAGNOSIS — E1169 Type 2 diabetes mellitus with other specified complication: Secondary | ICD-10-CM | POA: Diagnosis not present

## 2024-02-16 ENCOUNTER — Emergency Department
Admit: 2024-02-16 | Discharge: 2024-02-16 | Disposition: A | Discharge status: Home / Self Care | Payer: BLUE CROSS/BLUE SHIELD

## 2024-02-16 DIAGNOSIS — B349 Viral infection, unspecified: Principal | ICD-10-CM

## 2024-02-16 DIAGNOSIS — M79672 Pain in left foot: Principal | ICD-10-CM

## 2024-02-16 MED ADMIN — acetaminophen (TYLENOL) tablet 650 mg: 650 mg | ORAL | @ 19:00:00 | Stop: 2024-02-16

## 2024-02-16 NOTE — Unmapped (Signed)
 Pt ambulatory at discharge. Instructed to take otc meds as needed for symptom management, stay hydrated, follow up with PCP and return to the ED with any worsening/concerning symptoms. Pt verbalized understanding.
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
## 2024-02-16 NOTE — Unmapped (Signed)
 Patient in with complaints of sore throat, cough, and right ear pain for 1 week. Unknown fevers.

## 2024-02-19 ENCOUNTER — Emergency Department
Admit: 2024-02-19 | Discharge: 2024-02-19 | Disposition: A | Discharge status: Home / Self Care | Payer: BLUE CROSS/BLUE SHIELD

## 2024-02-19 DIAGNOSIS — R918 Other nonspecific abnormal finding of lung field: Principal | ICD-10-CM

## 2024-02-19 DIAGNOSIS — J069 Acute upper respiratory infection, unspecified: Principal | ICD-10-CM

## 2024-02-19 MED ORDER — ALBUTEROL SULFATE HFA 90 MCG/ACTUATION AEROSOL INHALER
Freq: Four times a day (QID) | RESPIRATORY_TRACT | 0 refills | 30.00000 days | Status: CP | PRN
Start: 2024-02-19 — End: 2024-03-20

## 2024-02-19 MED ORDER — DOXYCYCLINE HYCLATE 100 MG CAPSULE
ORAL_CAPSULE | Freq: Two times a day (BID) | ORAL | 0 refills | 10.00000 days | Status: CP
Start: 2024-02-19 — End: 2024-02-29

## 2024-02-19 MED ORDER — AMOXICILLIN 875 MG-POTASSIUM CLAVULANATE 125 MG TABLET
ORAL_TABLET | Freq: Two times a day (BID) | ORAL | 0 refills | 10.00000 days | Status: CP
Start: 2024-02-19 — End: 2024-02-29

## 2024-02-19 MED ADMIN — amoxicillin-clavulanate (AUGMENTIN) 875-125 mg per tablet 1 tablet: 1 | ORAL | @ 21:00:00 | Stop: 2024-02-19

## 2024-02-19 NOTE — Unmapped (Signed)
 Worsening cough since yesterday. Pain in chest with cough. Sputum is grey.

## 2024-02-19 NOTE — Unmapped (Cosign Needed)
 Emergency Department   ED Provider Note  Room: MT06/MT06    History     Chief Complaint:   Chief Complaint   Patient presents with    Congestion    - as stated in triage      History of Present Illness   Susan Novak is a 43 y.o. female presents with a cough for greater than a week and on and off fever.  Patient believes she has bronchitis.  She has been previously told that she likely has COPD but never diagnosed.  She does have albuterol  and Symbicort .  She needs a refill of her albuterol .  She does take doxycycline  twice daily for hidradenitis suppurativa.    The historian is the patient  Limitation to History: No limitations    Review of Systems:  See HPI, all other systems reviewed and are otherwise negative    Allergies: Allergies[1]    Past Medical History: She  has a past medical history of Anxiety, Arthritis, Asthma (HHS-HCC), At risk for falls, Caregiver burden, Cyst of ovary, right (03/2013), Depression, Diabetes mellitus, Financial difficulties, Gall bladder disease (2003-2004), GERD (gastroesophageal reflux disease), H/O trichomoniasis (2020), Headache(784.0), History of uterine fibroid, Hyperlipidemia, Lack of access to transportation, Liver disease, Obesity, Peptic ulceration, Peripheral neuropathy (2021), PTSD (post-traumatic stress disorder), STD (sexually transmitted disease), Substance abuse (CMS-HCC), Trauma, Urinary tract infection, and Visual impairment.    Past Surgical History:  If not listed below there is no pertinent surgical history.   She  has a past surgical history that includes Cholecystectomy; Dilation and curettage of uterus; pr revise median n/carpal tunnel surg (Left, 08/18/2016); pr revise median n/carpal tunnel surg (Right, 04/08/2017); and pr removal of nail plate (Bilateral, 09/21/2019).     Prior to Admission medications   Medication Sig Start Date End Date Taking? Authorizing Provider   acetaminophen  (TYLENOL ) 325 MG tablet Take 325 mg by mouth every six (6) hours as needed for pain.    [provider]   albuterol  HFA 90 mcg/actuation inhaler Inhale 2 puffs every six (6) hours as needed for wheezing. 02/19/24 03/20/24  Debarah Carlin Bruckner, PA   amoxicillin -clavulanate (AUGMENTIN ) 875-125 mg per tablet Take 1 tablet by mouth two (2) times a day for 10 days. 02/19/24 02/29/24  Debarah Carlin Bruckner, PA   blood sugar diagnostic Strp Test blood glucose two times per day 02/17/23 02/17/24  Lora Alfonso HERO, FNP   blood-glucose meter kit Use as directed 02/17/23 01/01/24  Lora Alfonso HERO, FNP   cetirizine  (ZYRTEC ) 10 MG tablet Take 1 tablet (10 mg total) by mouth daily. 01/05/24   McNeill, Selena Hammonds, FNP   doxycycline  (VIBRAMYCIN ) 100 MG capsule Take 1 capsule (100 mg total) by mouth two (2) times a day for 10 days. 02/19/24 02/29/24  Debarah Carlin Bruckner, PA   EPINEPHrine (EPIPEN) 0.3 mg/0.3 mL injection Please inject 0.3 mL into thigh in the case of severe SOB or anaphylaxis. Please report directly to the ED after administration. 01/31/24   Tommas Clotilda Ensign, PA   fluticasone propionate (FLONASE) 50 mcg/actuation nasal spray  06/16/21   [provider]   HUMIRA,CF, PEN 80 mg/0.8 mL PnKt  06/17/21   [provider]   lancets Misc Test 2 times daily 02/17/23   Lora Alfonso HERO, FNP   levonorgestreL (MIRENA) 20 mcg/24 hours (6 yrs) 52 mg IUD 1 each by Intrauterine route.    [provider]   lurasidone (LATUDA) 20 mg Tab Take 1 tablet (20 mg total)  by mouth daily. 02/11/24   Ritchie, Alger BIRCH, DNP   methylPREDNISolone  (MEDROL  DOSEPACK) 4 mg tablet Take 1 tablet (4 mg total) by mouth daily. follow package directions 06/29/23 06/28/24  Alyse Slater Pao, MD   mometasone (ELOCON) 0.1 % cream APPLY TO AFFECTED AREA TWICE A DAY 06/16/19   [provider]   omeprazole  (PRILOSEC) 40 MG capsule Take 1 capsule (40 mg total) by mouth daily. 01/05/24 01/04/25  McNeill, Selena Hammonds, FNP   polyethylene glycol (MIRALAX) 17 gram packet Take 17 g by mouth once as needed. for up to 15 doses 07/11/13   Teresa Morene Pac, MD   pregabalin  (LYRICA ) 75 MG capsule Take 1 capsule (75 mg total) by mouth Three (3) times a day. 12/12/20 07/13/23  Bartolo, Kathryne B, MD   semaglutide  (OZEMPIC ) 0.25 mg or 0.5 mg (2 mg/3 mL) PnIj Inject 0.25 mg under the skin once a week. 01/05/24 01/04/25  McNeill, Selena Hammonds, FNP   SYMBICORT  160-4.5 mcg/actuation inhaler Inhale 2 puffs  in the morning and 2 puffs in the evening. 01/05/24 01/04/25  McNeill, Selena Hammonds, FNP   tinidazole (TINDAMAX) 500 MG tablet Take 1 tablet (500 mg total) by mouth in the morning for 7 days. 01/31/24 02/07/24  Tommas Clotilda Ensign, PA   varenicline  tartrate (CHANTIX  PAK) 0.5 mg (11)- 1 mg (42) tablet Take one 0.5mg  tab once daily for 3 days,then increase to one 0.5mg  tab twice daily for 4 days,then increase to one 1mg  tab twice daily. 02/09/24 05/08/24  Aisha Alejo Spurr, FNP       Family History:   If not listed below there is no pertinent family history.  Her family history includes Alcohol abuse in her father; Arthritis in her maternal grandmother; Asthma in her maternal uncle, mother, sister, and son; COPD in her maternal grandmother and mother; Cancer in her maternal grandfather, mother, and another family member; Depression in her mother; Diabetes in her maternal aunt; Drug abuse in her father and mother; Early death in her maternal uncle; GER disease in her mother; Hearing loss in her mother; Heart attack in her father and maternal uncle; Heart disease in her maternal uncle; Heart failure in her maternal uncle; Hyperlipidemia in her maternal grandfather; Hypertension in her father, maternal aunt, maternal grandfather, and mother; Liver disease in her mother; Mental illness in her brother and son; Migraines in her maternal aunt; No Known Problems in her brother, paternal aunt, paternal grandfather, paternal grandmother, and paternal uncle; Seizures in an other family member; Vision loss in her maternal grandfather, maternal grandmother, and mother.    Social History: She  reports that she has been smoking cigarettes. She started smoking about 27 years ago. She has a 8.3 pack-year smoking history. She has never used smokeless tobacco. She reports current drug use. Frequency: 1.00 time per week. Drug: Marijuana. She reports that she does not drink alcohol.    Physical Exam    ED Triage Vitals [02/19/24 1448]   Enc Vitals Group      BP 99/75      Pulse 81      SpO2 Pulse       Resp 22      Temp 37.3 ??C (99.1 ??F)      Temp Source Oral      SpO2 97 %      Weight       Height       Head Circumference       Peak Flow  Pain Score       Pain Loc       Pain Education       Exclude from Growth Chart      CONSTITUTIONAL: Alert and oriented and responds appropriately to questions. Well-appearing  HEAD: Normocephalic; atraumatic  EYES: Extraocular movements intact; Conjunctivae normal  ENT: Normal nose; no epistaxis; moist mucous membranes  NECK: Normal range of motion  CARD: Rate 81  RESP: Normal chest excursion without splinting or tachypnea, question basilar rhonchi.  Pulse ox 97% on room air  ABD/GI: not distended  BACK:  Appears normal  EXT: Normal ROM in all joints  SKIN: Normal color for age and race; dry  NEURO: no slurred or dysarthric speech; Moves all extremities equally; Motor function intact  PSYCH: The patient's mood and manner are appropriate. Grooming and personal hygiene are appropriate    Results     Pertinent labs & imaging results that were available during my care of the patient were reviewed by me and considered in my medical decision making (see chart for details).    Results  No results found for this visit on 02/19/24.   XR Chest 2 views  Result Date: 02/19/2024  Exam:  Chest Two Views  History:  Cough  Technique:  2 views  Comparison:  Dec 12, 2023  Findings:  Slight improvement in lung volumes in comparison to prior. Persistent heterogeneous basilar opacities, likely subsegmental atelectasis and vascular crowding. No consolidation or edema. No pleural fluid. Heart size and mediastinal contours are normal. The bones and soft tissues are within normal limits.      1.    Low lung volumes with persistent basilar opacities, likely subsegmental atelectasis. 2.    No consolidation or edema.  Signed (Electronic Signature): 02/19/2024 4:46 PM Signed By: Deward DELENA Brock, MD    No results found for this visit on 02/19/24 (from the past 4464 hours).     Medical Decision Making     DDX: Certainly started off as a viral URI but question if it is developed into a bacterial infection.    Will obtain chest x-ray.    Progress Notes     Question basilar infiltrates.  Will prescribe Augmentin  to go in addition to her doxycycline .  Will also prescribe albuterol  and encouraged follow-up if needed.    Escalation of Care, Consideration of Admission/Observation/Transfer: However, patient was determined to be appropriate for outpatient management. See progress note for additional detail.      Disposition     Clinical Impression:   Final diagnoses:   Pulmonary infiltrates (Primary)   URI with cough and congestion               This document was generated through the use of dictation and/or voice recognition software, and as such, may contain spelling or other transcription errors. Any questions regarding the content of this document should be directed to the individual who electronically signed.             [1]   Allergies  Allergen Reactions    Aspirin Nausea And Vomiting    Flagyl [Metronidazole] Shortness Of Breath    Meloxicam Hives, Nausea Only and Rash    Opioids - Morphine Analogues Hives    Tramadol Hives    Ky Plus Spermicidal Jelly [Nonoxynol-9] Other (See Comments)     Yeast infection    Lubricants Rash     KY gel    Morphine Nausea And Vomiting  Debarah Carlin Bruckner, [place]  02/19/24 1714

## 2024-02-19 NOTE — Unmapped (Signed)
 Pt verbalized understanding of DC care instructions and will return to ED if complications arise.  Pt ambulated from room with a steady gait observed.  All belongings taken at time of DC

## 2024-04-06 DIAGNOSIS — E1169 Type 2 diabetes mellitus with other specified complication: Principal | ICD-10-CM

## 2024-04-06 DIAGNOSIS — K219 Gastro-esophageal reflux disease without esophagitis: Principal | ICD-10-CM

## 2024-04-06 DIAGNOSIS — Z720 Tobacco use: Principal | ICD-10-CM

## 2024-04-06 DIAGNOSIS — Z6841 Body Mass Index (BMI) 40.0 and over, adult: Principal | ICD-10-CM

## 2024-04-06 DIAGNOSIS — G5793 Unspecified mononeuropathy of bilateral lower limbs: Principal | ICD-10-CM

## 2024-04-06 DIAGNOSIS — Z1239 Encounter for other screening for malignant neoplasm of breast: Principal | ICD-10-CM

## 2024-04-06 DIAGNOSIS — Z13220 Encounter for screening for lipoid disorders: Principal | ICD-10-CM

## 2024-04-06 DIAGNOSIS — G4733 Obstructive sleep apnea (adult) (pediatric): Principal | ICD-10-CM

## 2024-04-06 DIAGNOSIS — J45909 Unspecified asthma, uncomplicated: Principal | ICD-10-CM

## 2024-04-06 DIAGNOSIS — Z7689 Persons encountering health services in other specified circumstances: Principal | ICD-10-CM

## 2024-04-06 DIAGNOSIS — L732 Hidradenitis suppurativa: Principal | ICD-10-CM

## 2024-04-06 MED ORDER — SYMBICORT 160 MCG-4.5 MCG/ACTUATION HFA AEROSOL INHALER
Freq: Two times a day (BID) | RESPIRATORY_TRACT | 1 refills | 92.00000 days | Status: CP
Start: 2024-04-06 — End: 2025-04-06

## 2024-04-06 MED ORDER — OMEPRAZOLE 40 MG CAPSULE,DELAYED RELEASE
ORAL_CAPSULE | Freq: Every day | ORAL | 1 refills | 90.00000 days | Status: CP
Start: 2024-04-06 — End: 2025-04-06

## 2024-04-06 MED ORDER — OZEMPIC 0.25 MG OR 0.5 MG (2 MG/3 ML) SUBCUTANEOUS PEN INJECTOR
SUBCUTANEOUS | 3 refills | 84.00000 days | Status: CP
Start: 2024-04-06 — End: 2025-04-06

## 2024-04-06 MED ORDER — VARENICLINE TARTRATE 0.5 MG (11)-1 MG (42) TABLETS IN A DOSE PACK
ORAL | 0 refills | 0.00000 days | Status: CP
Start: 2024-04-06 — End: 2024-07-04

## 2024-04-06 NOTE — Unmapped (Addendum)
 Nicotine  dependence with previous success using Chantix . Relapsed after running out of medication.  - Prescribe Chantix  for smoking cessation.  Orders:    varenicline  tartrate (CHANTIX  PAK) 0.5 mg (11)- 1 mg (42) tablet; Take one 0.5mg  tab once daily for 3 days,then increase to one 0.5mg  tab twice daily for 4 days,then increase to one 1mg  tab twice daily.    Ambulatory referral to Pulmonology; Future

## 2024-04-06 NOTE — Unmapped (Addendum)
 Type 2 diabetes with suboptimal control. Fasting blood sugars 150-200 mg/dL, postprandial over 799 mg/dL. Current Ozempic  dose may be insufficient. Increase Ozempic  to 0.5 mg weekly. Order fasting blood work including A1c. Advise checking blood sugar twice daily.  Orders:    semaglutide  (OZEMPIC ) 0.25 mg or 0.5 mg (2 mg/3 mL) PnIj; Inject 0.5 mg under the skin once a week.    Comprehensive Metabolic Panel; Future    Hemoglobin A1c; Future    Albumin/creatinine urine ratio; Future    Lipid Panel; Future    CBC w/ Differential; Future    Health Maintenance Outside Records Request; Future    Ambulatory referral to Podiatry; Future

## 2024-04-06 NOTE — Unmapped (Addendum)
 Orders:    semaglutide  (OZEMPIC ) 0.25 mg or 0.5 mg (2 mg/3 mL) PnIj; Inject 0.5 mg under the skin once a week.    Comprehensive Metabolic Panel; Future    Lipid Panel; Future

## 2024-04-06 NOTE — Unmapped (Addendum)
 See plan above.  Orders:    semaglutide  (OZEMPIC ) 0.25 mg or 0.5 mg (2 mg/3 mL) PnIj; Inject 0.5 mg under the skin once a week.    Ambulatory referral to Pulmonology; Future

## 2024-04-06 NOTE — Unmapped (Addendum)
 Suspected chronic COPD with suboptimal control on Symbicort . Strong family history. Continues smoking, exacerbating symptoms. Nocturnal symptoms present. No prior pulmonology consultation. Refer to pulmonologist for evaluation and management, including pulmonary function tests. Prescribe Chantix  for smoking cessation.  Orders:    SYMBICORT  160-4.5 mcg/actuation inhaler; Inhale 2 puffs in the morning and 2 puffs before bedtime.    Ambulatory referral to Pulmonology; Future
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
## 2024-04-06 NOTE — Unmapped (Addendum)
 Stable will continue ongoing management per dermatology.

## 2024-04-07 ENCOUNTER — Ambulatory Visit: Admit: 2024-04-07 | Discharge: 2024-04-08 | Payer: Medicaid (Managed Care)

## 2024-04-07 LAB — CBC W/ AUTO DIFF
BASOPHILS ABSOLUTE COUNT: 0 10*9/L (ref 0.0–0.1)
BASOPHILS RELATIVE PERCENT: 0.4 %
EOSINOPHILS ABSOLUTE COUNT: 0.4 10*9/L (ref 0.0–0.5)
EOSINOPHILS RELATIVE PERCENT: 3.5 %
HEMATOCRIT: 41.7 % (ref 34.0–44.0)
HEMOGLOBIN: 13.9 g/dL (ref 11.3–14.9)
LYMPHOCYTES ABSOLUTE COUNT: 2.9 10*9/L (ref 1.1–3.6)
LYMPHOCYTES RELATIVE PERCENT: 27.9 %
MEAN CORPUSCULAR HEMOGLOBIN CONC: 33.3 g/dL (ref 32.0–36.0)
MEAN CORPUSCULAR HEMOGLOBIN: 28.6 pg (ref 25.9–32.4)
MEAN CORPUSCULAR VOLUME: 86 fL (ref 77.6–95.7)
MEAN PLATELET VOLUME: 9.5 fL (ref 6.8–10.7)
MONOCYTES ABSOLUTE COUNT: 0.6 10*9/L (ref 0.3–0.8)
MONOCYTES RELATIVE PERCENT: 5.9 %
NEUTROPHILS ABSOLUTE COUNT: 6.5 10*9/L (ref 1.8–7.8)
NEUTROPHILS RELATIVE PERCENT: 62.3 %
PLATELET COUNT: 298 10*9/L (ref 150–450)
RED BLOOD CELL COUNT: 4.86 10*12/L (ref 3.95–5.13)
RED CELL DISTRIBUTION WIDTH: 16.2 % — ABNORMAL HIGH (ref 12.2–15.2)
WBC ADJUSTED: 10.4 10*9/L (ref 3.6–11.2)

## 2024-04-07 LAB — ALBUMIN / CREATININE URINE RATIO
ALBUMIN QUANT URINE: 1.3 mg/dL
ALBUMIN/CREATININE RATIO: 6.8 ug/mg (ref 0.0–30.0)
CREATININE, URINE: 190 mg/dL

## 2024-04-07 LAB — COMPREHENSIVE METABOLIC PANEL
ALBUMIN: 3.5 g/dL (ref 3.4–5.0)
ALKALINE PHOSPHATASE: 102 U/L (ref 46–116)
ALT (SGPT): 15 U/L (ref 10–49)
ANION GAP: 11 mmol/L (ref 3–11)
AST (SGOT): 23 U/L (ref <34)
BILIRUBIN TOTAL: 0.5 mg/dL (ref 0.3–1.2)
BLOOD UREA NITROGEN: 13 mg/dL (ref 9–23)
BUN / CREAT RATIO: 22
CALCIUM: 9.2 mg/dL (ref 8.7–10.4)
CHLORIDE: 106 mmol/L (ref 98–107)
CO2: 23 mmol/L (ref 20.0–31.0)
CREATININE: 0.6 mg/dL (ref 0.55–1.02)
EGFR CKD-EPI (2021) FEMALE: >90 mL/min/1.73m2 (ref >=60)
GLUCOSE RANDOM: 89 mg/dL (ref 70–179)
POTASSIUM: 4.5 mmol/L (ref 3.5–5.1)
PROTEIN TOTAL: 7.2 g/dL (ref 5.7–8.2)
SODIUM: 140 mmol/L (ref 136–145)

## 2024-04-07 LAB — LIPID PANEL
CHOLESTEROL: 156 mg/dL (ref <200)
HDL CHOLESTEROL: 35 mg/dL — ABNORMAL LOW (ref >50)
LDL CHOLESTEROL CALCULATED: 102 mg/dL — ABNORMAL HIGH (ref <100)
NON-HDL CHOLESTEROL: 121 mg/dL (ref <130)
TRIGLYCERIDES: 120 mg/dL (ref <150)

## 2024-04-07 NOTE — Unmapped (Signed)
 Mood disorder managed with Latuda. Issues with telemedicine appointments with psychiatrist. Continue Latuda as prescribed.

## 2024-04-08 LAB — HEMOGLOBIN A1C
ESTIMATED AVERAGE GLUCOSE: 146 mg/dL
HEMOGLOBIN A1C: 6.7 % — ABNORMAL HIGH (ref 4.8–5.6)

## 2024-04-13 DIAGNOSIS — E1169 Type 2 diabetes mellitus with other specified complication: Principal | ICD-10-CM

## 2024-04-13 MED ORDER — LANCETS
11 refills | 0.00000 days | Status: CP
Start: 2024-04-13 — End: ?

## 2024-04-13 MED ORDER — BLOOD GLUCOSE TEST STRIPS
ORAL_STRIP | Freq: Two times a day (BID) | 1 refills | 0.00000 days | Status: CP
Start: 2024-04-13 — End: 2025-05-13

## 2024-04-13 NOTE — Unmapped (Signed)
 Copied from CRM #[id number]. Topic: Referral - Referral Status  >> Apr 13, 2024  2:01 PM [facility] wrote:  Facility called pt & isn't scheduling until April of nxt year. She is wanting to know if it can be sent somewhere else?  Status Needed: They are inquiring about the status of the referral/order request for Referral: Podiatry. Facility Request: [facility]. Coverage: yes, coverage is accurate on file.   The patient preferred contact: Cell Phone Telephone Information:  Mobile          [phone]   Routine callback turnaround time: 24-48 business hours. Programmer, systems Notified)

## 2024-04-13 NOTE — Unmapped (Signed)
 Copied from CRM [id number]. Topic: Access To Clinicians - Medication Refill  >> Apr 13, 2024  2:06 PM [facility] wrote:      The patient is requesting the following:     Medication(s): test strips for glucose meter & lancets (flat ones)    Quantity for 1 month supply    Pharmacy name and address: [facility] - [place], [place] - [address]  [address] [place] [address]  Phone: [phone] Fax: [phone]  Hours: Not open 24 hours            Coverage: yes, coverage is accurate on file.    Urgent turnaround time: within 24 business hours. (Caller Notified)    Urgent Reason: Has 2 days or less of medication(s) remaining.       Does the caller want to be contacted regarding this request? Yes. Please contact The patient by Cell Phone Telephone Information:  Mobile          [phone]

## 2024-04-18 DIAGNOSIS — G629 Polyneuropathy, unspecified: Principal | ICD-10-CM

## 2024-04-18 DIAGNOSIS — Z720 Tobacco use: Principal | ICD-10-CM

## 2024-04-18 DIAGNOSIS — M170 Bilateral primary osteoarthritis of knee: Principal | ICD-10-CM

## 2024-04-18 DIAGNOSIS — Z6841 Body Mass Index (BMI) 40.0 and over, adult: Principal | ICD-10-CM

## 2024-04-18 MED ORDER — PREGABALIN 75 MG CAPSULE
ORAL_CAPSULE | Freq: Two times a day (BID) | ORAL | 1 refills | 90.00000 days | Status: CP
Start: 2024-04-18 — End: 2025-04-18
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
## 2024-04-18 NOTE — Unmapped (Addendum)
 Encounter Diagnoses   Name Primary?    Osteoarthritis of patellofemoral joints, bilateral Yes    Neuropathy     Morbid obesity with body mass index (BMI) of 50.0 to 59.9 in adult (CMS-HCC)     Tobacco use      Bilateral knee osteoarthritis - Hinged knee brace.    Continue Chantix  to help stop smoking.     Obesity - Encouraged to continue to work on weight loss that can help with medical conditions and her lower extremity pain.  Specifically recommended diet and exercise changes.    Neuropathy - Restart Lyrica .

## 2024-04-21 ENCOUNTER — Inpatient Hospital Stay: Admit: 2024-04-21 | Discharge: 2024-04-21 | Payer: Medicaid (Managed Care)

## 2024-04-22 ENCOUNTER — Inpatient Hospital Stay: Admit: 2024-04-22 | Discharge: 2024-04-23 | Payer: Medicaid (Managed Care)

## 2024-04-27 DIAGNOSIS — R52 Pain, unspecified: Principal | ICD-10-CM

## 2024-04-27 MED ORDER — IBUPROFEN 600 MG TABLET
ORAL_TABLET | Freq: Four times a day (QID) | ORAL | 0 refills | 23.00000 days | PRN
Start: 2024-04-27 — End: ?

## 2024-04-27 NOTE — Unmapped (Signed)
 Pt called requesting referral for podiatry be changed to a new provider. New referral submitted with preferred provider.

## 2024-04-28 DIAGNOSIS — Z0189 Encounter for other specified special examinations: Principal | ICD-10-CM

## 2024-04-28 DIAGNOSIS — Z1159 Encounter for screening for other viral diseases: Principal | ICD-10-CM

## 2024-04-28 DIAGNOSIS — Z119 Encounter for screening for infectious and parasitic diseases, unspecified: Principal | ICD-10-CM

## 2024-04-28 DIAGNOSIS — L732 Hidradenitis suppurativa: Principal | ICD-10-CM

## 2024-04-28 DIAGNOSIS — Z111 Encounter for screening for respiratory tuberculosis: Principal | ICD-10-CM

## 2024-04-28 DIAGNOSIS — Z79899 Other long term (current) drug therapy: Principal | ICD-10-CM

## 2024-04-28 NOTE — Unmapped (Signed)
 Patient cannot take ibuprofen  800 mg indefinitely for chronic leg pain. It appears ortho prescribe lyrica . Please follow up with ortho.

## 2024-04-29 ENCOUNTER — Encounter: Admit: 2024-04-29 | Discharge: 2024-04-30 | Payer: Medicaid (Managed Care) | Attending: Family | Primary: Family

## 2024-04-29 ENCOUNTER — Ambulatory Visit: Admit: 2024-04-29 | Discharge: 2024-04-30 | Payer: Medicaid (Managed Care)

## 2024-04-29 DIAGNOSIS — R52 Pain, unspecified: Principal | ICD-10-CM

## 2024-04-29 DIAGNOSIS — R399 Unspecified symptoms and signs involving the genitourinary system: Principal | ICD-10-CM

## 2024-04-29 DIAGNOSIS — N898 Other specified noninflammatory disorders of vagina: Principal | ICD-10-CM

## 2024-04-29 LAB — CBC W/ AUTO DIFF
BASOPHILS ABSOLUTE COUNT: 0 10*9/L (ref 0.0–0.1)
BASOPHILS RELATIVE PERCENT: 0.4 %
EOSINOPHILS ABSOLUTE COUNT: 0.4 10*9/L (ref 0.0–0.5)
EOSINOPHILS RELATIVE PERCENT: 3.8 %
HEMATOCRIT: 42.4 % (ref 34.0–44.0)
HEMOGLOBIN: 13.9 g/dL (ref 11.3–14.9)
LYMPHOCYTES ABSOLUTE COUNT: 2.6 10*9/L (ref 1.1–3.6)
LYMPHOCYTES RELATIVE PERCENT: 26.4 %
MEAN CORPUSCULAR HEMOGLOBIN CONC: 32.7 g/dL (ref 32.0–36.0)
MEAN CORPUSCULAR HEMOGLOBIN: 28.4 pg (ref 25.9–32.4)
MEAN CORPUSCULAR VOLUME: 86.8 fL (ref 77.6–95.7)
MEAN PLATELET VOLUME: 9.6 fL (ref 6.8–10.7)
MONOCYTES ABSOLUTE COUNT: 0.5 10*9/L (ref 0.3–0.8)
MONOCYTES RELATIVE PERCENT: 4.8 %
NEUTROPHILS ABSOLUTE COUNT: 6.5 10*9/L (ref 1.8–7.8)
NEUTROPHILS RELATIVE PERCENT: 64.6 %
PLATELET COUNT: 285 10*9/L (ref 150–450)
RED BLOOD CELL COUNT: 4.89 10*12/L (ref 3.95–5.13)
RED CELL DISTRIBUTION WIDTH: 15.9 % — ABNORMAL HIGH (ref 12.2–15.2)
WBC ADJUSTED: 10 10*9/L (ref 3.6–11.2)

## 2024-04-29 LAB — COMPREHENSIVE METABOLIC PANEL
ALBUMIN: 3.3 g/dL — ABNORMAL LOW (ref 3.4–5.0)
ALKALINE PHOSPHATASE: 121 U/L — ABNORMAL HIGH (ref 46–116)
ALT (SGPT): 18 U/L (ref 10–49)
ANION GAP: 10 mmol/L (ref 3–11)
AST (SGOT): 18 U/L (ref <34)
BILIRUBIN TOTAL: 0.4 mg/dL (ref 0.3–1.2)
BLOOD UREA NITROGEN: 10 mg/dL (ref 9–23)
BUN / CREAT RATIO: 14
CALCIUM: 9 mg/dL (ref 8.7–10.4)
CHLORIDE: 104 mmol/L (ref 98–107)
CO2: 25 mmol/L (ref 20.0–31.0)
CREATININE: 0.7 mg/dL (ref 0.55–1.02)
EGFR CKD-EPI (2021) FEMALE: >90 mL/min/1.73m2 (ref >=60)
GLUCOSE RANDOM: 199 mg/dL — ABNORMAL HIGH (ref 70–179)
POTASSIUM: 4.6 mmol/L (ref 3.5–5.1)
PROTEIN TOTAL: 6.5 g/dL (ref 5.7–8.2)
SODIUM: 139 mmol/L (ref 136–145)

## 2024-04-29 LAB — HEPATITIS PANEL, ACUTE
HCV S/CO VALUE: <0.02
HEPATITIS A IGM ANTIBODY: NONREACTIVE
HEPATITIS B CORE IGM ANTIBODY: NONREACTIVE
HEPATITIS B SURFACE ANTIGEN: NONREACTIVE
HEPATITIS C ANTIBODY: NONREACTIVE

## 2024-04-29 MED ORDER — IBUPROFEN 600 MG TABLET
ORAL_TABLET | Freq: Three times a day (TID) | ORAL | 0 refills | 10.00000 days | Status: CP | PRN
Start: 2024-04-29 — End: 2024-05-09

## 2024-04-29 MED ORDER — NITROFURANTOIN MONOHYDRATE/MACROCRYSTALS 100 MG CAPSULE
ORAL_CAPSULE | Freq: Two times a day (BID) | ORAL | 0 refills | 5.00000 days | Status: CP
Start: 2024-04-29 — End: 2024-05-04

## 2024-04-29 NOTE — Unmapped (Signed)
 Patient ID: Susan Novak, date of birth Apr 25, 1981 is a 43 y.o. female.    Chief Complaint:   Chief Complaint   Patient presents with    Acute Illness     Pt c/o dysuria x 2 days.       Assessment/Plan:  Assessment & Plan  Urinary tract infection  Acute UTI with dysuria and frequency. Differential includes bacterial vaginosis and yeast infection. Opted for self-swab for vaginal infection testing.  - Obtain urine sample for culture.  - Prescribe Nitrofurantoin .  - Instruct self-swab for vaginal infection testing.  - Send swab for yeast, BV, and trichomonas testing.  - Use [facility] MyChart for results communication.    Chronic leg and foot pain  Chronic pain managed with NSAIDs.  Discussed risks of daily NSAID use.  - Prescribe Ibuprofen  with caution.  - Advise against NSAIDs more than twice daily. Advised only PRN.  - Confirm podiatrist referral.    Assessment & Plan  UTI symptoms    Orders:    POCT urinalysis dipstick    Urine culture; Future    nitrofurantoin , macrocrystal-monohydrate, (MACROBID ) 100 MG capsule; Take 1 capsule (100 mg total) by mouth two (2) times a day for 5 days.    Vaginal irritation    Orders:    Vaginitis Molecular Panel; Future    Pain    Orders:    ibuprofen  (MOTRIN ) 600 MG tablet; Take 1 tablet (600 mg total) by mouth every eight (8) hours as needed for pain for up to 10 days.        No follow-ups on file.    HPI: HPI    History of Present Illness  Susan Novak is a 43 year old female who presents with symptoms suggestive of a urinary tract infection.    She experiences dysuria and urinary frequency with minimal output for the past two days. Vaginal discharge is present without itching or odor, with slight irritation. She has a history of bacterial vaginosis and trichomonas infection, both treated. She has not been sexually active since May due to participation in a drug rehabilitation program. She is requesting Motrin  for chronic leg pain since she is unable to take Lyrica  due to rehab facility rules. She experiences chronic leg and foot pain and is awaiting a podiatry appointment. 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 Her mother has hepatitis C.    Objective:  Vitals:    04/29/24 1334   BP: 103/63   BP Position: Sitting   Pulse: 88   Resp: 16   Temp: 36.9 ??C (98.4 ??F)   SpO2: 97%   Weight: (!) 166.5 kg (367 lb)     Body mass index is 57.48 kg/m??.    Results:   Results        Physical Exam:   Physical Exam      Physical Exam  Vitals reviewed.   Constitutional:       General: She is not in acute distress.     Appearance: Normal appearance. She is well-developed. She is not ill-appearing or toxic-appearing.   Cardiovascular:      Rate and Rhythm: Normal rate and regular rhythm.      Heart sounds: Normal heart sounds.   Pulmonary:      Effort: Pulmonary effort is normal.      Breath sounds: Normal breath sounds.   Abdominal:      General: Bowel sounds are normal. There is no distension.  Palpations: Abdomen is soft.      Tenderness: There is no abdominal tenderness. There is no guarding or rebound.   Skin:     General: Skin is warm and dry.   Neurological:      Mental Status: She is alert and oriented to person, place, and time.   Psychiatric:         Behavior: Behavior normal.                     Manuelita KATHEE Nice, FNP

## 2024-04-29 NOTE — Unmapped (Signed)
 Copied from CRM #[id number]. Topic: Access To Clinicians - Medication Refill  >> Apr 29, 2024  4:40 PM Dorothyann BROCKS wrote:      The patient is requesting the following:     Medication(s): nitrofurantoin , macrocrystal-monohydrate, (MACROBID ) 100 MG capsule   ibuprofen  (MOTRIN ) 600 MG tablet  Quantity for 1 month supply    Pharmacy name and address: [facility] - [place], [place] - [id number]        Coverage: yes, coverage is accurate on file.    Urgent turnaround time: within 24 business hours. (Caller Notified)    Urgent Reason: Completely out of medication(s)      Does the caller want to be contacted regarding this request? Yes. Please contact The patient by Cell Phone Telephone Information:  Mobile          [phone]

## 2024-04-29 NOTE — Unmapped (Signed)
 Orders:    ibuprofen  (MOTRIN ) 600 MG tablet; Take 1 tablet (600 mg total) by mouth every eight (8) hours as needed for pain for up to 10 days.

## 2024-04-29 NOTE — Unmapped (Signed)
 Rxs sent to [facility] mail order.  Patient needed to pick up at [facility]

## 2024-05-01 DIAGNOSIS — N760 Acute vaginitis: Principal | ICD-10-CM

## 2024-05-01 DIAGNOSIS — A599 Trichomoniasis, unspecified: Principal | ICD-10-CM

## 2024-05-01 DIAGNOSIS — N390 Urinary tract infection, site not specified: Principal | ICD-10-CM

## 2024-05-01 DIAGNOSIS — B9689 Other specified bacterial agents as the cause of diseases classified elsewhere: Principal | ICD-10-CM

## 2024-05-01 MED ORDER — SULFAMETHOXAZOLE 800 MG-TRIMETHOPRIM 160 MG TABLET
ORAL_TABLET | Freq: Two times a day (BID) | ORAL | 0 refills | 5.00000 days | Status: CP
Start: 2024-05-01 — End: 2024-05-06

## 2024-05-01 MED ORDER — TINIDAZOLE 500 MG TABLET
ORAL_TABLET | ORAL | 0 refills | 0.00000 days | Status: CP
Start: 2024-05-01 — End: ?

## 2024-05-02 LAB — QUANTIFERON TB GOLD PLUS
QUANTIFERON ANTIGEN 1 MINUS NIL: -0.03 [IU]/mL
QUANTIFERON ANTIGEN 2 MINUS NIL: 0 [IU]/mL
QUANTIFERON MITOGEN: 9.91 [IU]/mL
QUANTIFERON TB GOLD PLUS: NEGATIVE
QUANTIFERON TB NIL VALUE: 0.09 [IU]/mL

## 2024-05-02 LAB — TB MITOGEN: TB MITOGEN VALUE: 10

## 2024-05-02 LAB — TB NIL: TB NIL VALUE: 0.09

## 2024-05-02 LAB — TB AG1: TB AG1 VALUE: 0.06

## 2024-05-02 LAB — TB AG2: TB AG2 VALUE: 0.09

## 2024-05-03 NOTE — Unmapped (Signed)
 Copied from CRM #[id number]. Topic: Other - Other  >> May 03, 2024  4:10 PM Marylynn ORN wrote:  Pt called and states that Tinidazole  was called in last week but it's not covered by Ins, can something else be called in? Pls call pt #[phone]

## 2024-05-04 DIAGNOSIS — A599 Trichomoniasis, unspecified: Principal | ICD-10-CM

## 2024-05-04 DIAGNOSIS — B9689 Other specified bacterial agents as the cause of diseases classified elsewhere: Principal | ICD-10-CM

## 2024-05-04 DIAGNOSIS — N760 Acute vaginitis: Principal | ICD-10-CM

## 2024-05-04 MED ORDER — TINIDAZOLE 500 MG TABLET
ORAL_TABLET | ORAL | 0 refills | 0.00000 days | Status: CP
Start: 2024-05-04 — End: ?

## 2024-05-04 NOTE — Unmapped (Unsigned)
 Copied from CRM #[id number]. Topic: Access To Clinicians - Medication Refill  >> May 04, 2024  2:25 PM Lela A wrote:      The patient is requesting the following:     Medication(s): tinidazole  (TINDAMAX ) 500 MG tablet     Quantity for 3 month supply    Pharmacy name and address: [facility] - [place], [place] - [address]  [address] [place] [address]  Phone: [phone] Fax: [phone]  Hours: Not open 24 hours            Coverage: yes, coverage is accurate on file.    Urgent turnaround time: within 24 business hours. (Caller Notified)    Urgent Reason: Completely out of medication(s)      Does the caller want to be contacted regarding this request? Yes. Please contact The patient by Cell Phone Telephone Information:  Mobile          [phone]    She fills its being denied because of her insurance

## 2024-05-09 DIAGNOSIS — M170 Bilateral primary osteoarthritis of knee: Principal | ICD-10-CM

## 2024-05-09 DIAGNOSIS — Z720 Tobacco use: Principal | ICD-10-CM

## 2024-05-09 DIAGNOSIS — G629 Polyneuropathy, unspecified: Principal | ICD-10-CM

## 2024-05-09 DIAGNOSIS — Z6841 Body Mass Index (BMI) 40.0 and over, adult: Principal | ICD-10-CM

## 2024-05-09 MED ORDER — VARENICLINE TARTRATE 1 MG TABLET
ORAL_TABLET | Freq: Two times a day (BID) | ORAL | 2 refills | 30.00000 days | Status: CP
Start: 2024-05-09 — End: 2024-08-07

## 2024-05-09 MED ORDER — NORTRIPTYLINE 10 MG CAPSULE
ORAL_CAPSULE | Freq: Every evening | ORAL | 1 refills | 90.00000 days | Status: CP
Start: 2024-05-09 — End: 2025-05-09

## 2024-05-09 NOTE — Unmapped (Addendum)
 Encounter Diagnoses   Name Primary?    Tobacco use Yes    Neuropathy     Morbid obesity with body mass index (BMI) of 50.0 to 59.9 in adult (CMS-HCC)     Osteoarthritis of patellofemoral joints, bilateral      Working on custom hinged knee braces.    Due to facility issues with Lyrica , we will switch to Nortriptyline low dose.    Encouraged to continue working on diet and exercise changes to help with her weight.    Continue Chantix  (refilled)
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
## 2024-05-25 ENCOUNTER — Inpatient Hospital Stay: Admit: 2024-05-25 | Discharge: 2024-05-25 | Payer: Medicaid (Managed Care)

## 2024-05-25 ENCOUNTER — Ambulatory Visit: Admit: 2024-05-25 | Discharge: 2024-05-25 | Payer: Medicaid (Managed Care) | Attending: Podiatrist | Primary: Podiatrist

## 2024-05-25 DIAGNOSIS — E1142 Type 2 diabetes mellitus with diabetic polyneuropathy: Principal | ICD-10-CM

## 2024-05-25 DIAGNOSIS — M7661 Achilles tendinitis, right leg: Principal | ICD-10-CM

## 2024-05-25 DIAGNOSIS — M19072 Primary osteoarthritis, left ankle and foot: Principal | ICD-10-CM

## 2024-05-25 DIAGNOSIS — M19071 Primary osteoarthritis, right ankle and foot: Principal | ICD-10-CM

## 2024-05-25 DIAGNOSIS — M7662 Achilles tendinitis, left leg: Principal | ICD-10-CM

## 2024-05-25 NOTE — Unmapped (Signed)
 Subjective:   Location: right heel and left heel  Nature:  aching, sharp, throbbing, and tingling  Duration estimate: 6 months  Pain level at it's highest: 10  Onset: suddenly gradually  Course: unchanged  Ancillary tests: none  Treatment (previous): none

## 2024-05-25 NOTE — Unmapped (Signed)
 Patient Education        Diabetes Foot Health: Care Instructions  When you have diabetes, your feet need extra care. Diabetes can damage nerves and blood vessels in your feet. You may not feel a blister, callus, or other injury. It could become a larger sore (ulcer), or it could lead to a serious infection. Taking care of your feet can help prevent these problems.  How can you care for your feet?  Caring for your feet can be quick and easy. You may want to do your daily foot care when you???re bathing or getting ready for bed.   Check your feet daily for blisters or sores. Use a mirror or have someone help you, if needed.    Wash your feet every day. Use warm (not hot) water. Pat your feet dry. Don???t rub. And dry well between your toes.    Put a thin layer of lotion on your feet. Don???t put lotion between your toes. Stop using any lotion that causes a rash.    Talk to your doctor about trimming your own toenails. Have them trimmed at your doctor???s office or at a foot clinic (podiatrist), if needed. This is especially important if you have neuropathy or vision loss, or if your toenails are thick and yellow.    If you trim your own toenails, trim them straight across. Trim them after you bathe. Use a nail file to smooth sharp edges.    Use proper footwear. Don???t wear sandals or shoes with very thin soles, and don???t go barefoot.    Tell your doctor if you have a foot problem. Get treatment for a foot problem right away, even if it???s something small like a blister.    Track your blood sugar to keep it in your target range. Follow your meal plan, and try to be active each day. Take medicines as prescribed.    If you smoke, quit or cut back as much as you can. Talk to your doctor if you need help quitting.   How can you choose shoes?    Wear shoes that fit well. Look for shoes that have plenty of space around the toes. Always wear socks with your shoes.   Break in new shoes by wearing them for no more than an hour a day for several days. Choose shoes made of cloth or leather, not plastic.   When should you call for help?    Call your doctor now or seek immediate medical care if:  You have a foot sore, an ulcer or break in the skin, bleeding corns or calluses, or an ingrown toenail.  You have blue or black areas, which can mean bruising or blood flow problems.  You have peeling skin or tiny blisters between your toes or cracking or oozing of the skin.  You have a fever for more than 24 hours and a foot sore.  You have new numbness or tingling in your feet that does not go away after you move your feet or change positions.  You have unexplained or unusual swelling of the foot or ankle.  Watch closely for changes in your health, and be sure to contact your doctor if:  You cannot do proper foot care.  Follow-up care is a key part of your treatment and safety. Be sure to make and go to all appointments, and call your doctor if you are having problems. It's also a good idea to know your test results and keep a list of  the medicines you take.  Where can you learn more?  Go to [facility] at [email or web address].[employer] forces [email or web address] in the Menu. Enter A739 in the search box to learn more about Diabetes Foot Health: Care Instructions.  Current as of: February 15, 2024  Content Version: 14.6  ?? 2024-2025 [place], [place].   Care instructions adapted under license by [facility]. If you have questions about a medical condition or this instruction, always ask your healthcare professional. Romayne Alderman, [facility], disclaims any warranty or liability for your use of this information.
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
## 2024-05-31 NOTE — Unmapped (Signed)
 Pt was returning home and missed step into the house. Pt reporting L hip and ankle are hurting and R arm hurt. Pt reporting bruising on R knee. Pt fell on R side. Pt is ambulatory with limp. Pt reports she believes she missed the step due to her bifocals.     Dispo recommendation keep appointment with acute care provider 06/01/24.

## 2024-06-02 DIAGNOSIS — M25562 Pain in left knee: Principal | ICD-10-CM

## 2024-06-02 DIAGNOSIS — M25552 Pain in left hip: Principal | ICD-10-CM

## 2024-06-02 DIAGNOSIS — M79642 Pain in left hand: Principal | ICD-10-CM

## 2024-06-02 DIAGNOSIS — M2550 Pain in unspecified joint: Principal | ICD-10-CM

## 2024-06-02 DIAGNOSIS — S99911A Unspecified injury of right ankle, initial encounter: Principal | ICD-10-CM

## 2024-06-02 DIAGNOSIS — M25561 Pain in right knee: Principal | ICD-10-CM

## 2024-06-02 DIAGNOSIS — Z8619 Personal history of other infectious and parasitic diseases: Principal | ICD-10-CM

## 2024-06-02 DIAGNOSIS — W101XXA Fall (on)(from) sidewalk curb, initial encounter: Principal | ICD-10-CM

## 2024-06-02 DIAGNOSIS — M79641 Pain in right hand: Principal | ICD-10-CM

## 2024-06-02 MED ADMIN — ketorolac (TORADOL) injection 30 mg: 30 mg | INTRAMUSCULAR | @ 21:00:00 | Stop: 2024-06-02
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
## 2024-06-03 ENCOUNTER — Encounter: Admit: 2024-06-03 | Discharge: 2024-06-03 | Payer: Medicaid (Managed Care) | Attending: Medical | Primary: Medical

## 2024-06-03 ENCOUNTER — Inpatient Hospital Stay: Admit: 2024-06-03 | Discharge: 2024-06-03 | Payer: Medicaid (Managed Care)

## 2024-06-06 NOTE — Telephone Encounter (Signed)
 Spoke with Miranda at [facility] [employer] to discuss faxing recent vaginal swab results to our office. She reports that test will need to be repeated and will be done during their second shift. Will await results to come once test has been completed.

## 2024-06-07 MED ORDER — TINIDAZOLE 500 MG TABLET
ORAL_TABLET | ORAL | 0 refills | 0.00000 days | Status: CP
Start: 2024-06-07 — End: ?
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
## 2024-06-22 DIAGNOSIS — Z3009 Encounter for other general counseling and advice on contraception: Principal | ICD-10-CM

## 2024-06-22 DIAGNOSIS — E1169 Type 2 diabetes mellitus with other specified complication: Principal | ICD-10-CM

## 2024-06-22 DIAGNOSIS — Z3202 Encounter for pregnancy test, result negative: Principal | ICD-10-CM

## 2024-06-22 DIAGNOSIS — Z6841 Body Mass Index (BMI) 40.0 and over, adult: Principal | ICD-10-CM

## 2024-06-22 DIAGNOSIS — Z30432 Encounter for removal of intrauterine contraceptive device: Principal | ICD-10-CM

## 2024-06-22 DIAGNOSIS — K219 Gastro-esophageal reflux disease without esophagitis: Principal | ICD-10-CM

## 2024-06-22 DIAGNOSIS — G4733 Obstructive sleep apnea (adult) (pediatric): Principal | ICD-10-CM

## 2024-06-22 MED ORDER — LANCETS
11 refills | 0.00000 days | Status: CP
Start: 2024-06-22 — End: ?

## 2024-06-22 MED ORDER — OZEMPIC 1 MG/DOSE (4 MG/3 ML) SUBCUTANEOUS PEN INJECTOR
SUBCUTANEOUS | 0 refills | 28.00000 days | Status: CP
Start: 2024-06-22 — End: ?

## 2024-06-22 MED ORDER — OZEMPIC 0.25 MG OR 0.5 MG (2 MG/3 ML) SUBCUTANEOUS PEN INJECTOR
SUBCUTANEOUS | 3 refills | 84.00000 days | Status: CN
Start: 2024-06-22 — End: 2025-06-22

## 2024-06-22 MED ORDER — OMEPRAZOLE 40 MG CAPSULE,DELAYED RELEASE
ORAL_CAPSULE | Freq: Every day | ORAL | 3 refills | 100.00000 days | Status: CP
Start: 2024-06-22 — End: ?

## 2024-06-22 MED ORDER — BLOOD GLUCOSE TEST STRIPS
ORAL_STRIP | Freq: Two times a day (BID) | 1 refills | 0.00000 days | Status: CP
Start: 2024-06-22 — End: 2025-07-22

## 2024-06-22 NOTE — Telephone Encounter (Signed)
 06/22/24  8:00 AM  I am doing great on it I have one dose left of the 0.5 and yes I would love to increase  Me to Harlene Charlies Sink  AM      06/22/24  7:49 AM  Good morning Harlene,     We got your refill request, are you tolerating the Ozempic  well without bothersome side effects?  Are you ready to increase to the next dose?     Thank you,     Lorn Elbe, CMA  [facility] [place]  [phone] option 3
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
## 2024-06-22 NOTE — Progress Notes (Signed)
 GYNECOLOGY CLINIC PROCEDURE NOTE    Susan Novak is a 43 y.o. [id number] here for Mirena IUD removal. Urine pregnancy test was negative.    IUD Removal   Patient identified, informed consent performed.  Patient was in the dorsal lithotomy position, normal external genitalia was noted.  A speculum was placed in the patient's vagina, normal discharge was noted, no lesions. The cervix was visualized, no lesions, no abnormal discharge.  The strings of the IUD were grasped and pulled using ring forceps. The IUD was removed in its entirety.  Patient tolerated the procedure well.      Female chaperone, Adria Dade CMA,  present for exam.     The time to perform the procedure was not used for time-based billing purposes.      Tillman Neighbor PA-C

## 2024-06-23 DIAGNOSIS — Z3169 Encounter for other general counseling and advice on procreation: Principal | ICD-10-CM

## 2024-06-28 NOTE — Progress Notes (Signed)
 OB Records Documentation    Referral received from [facility]. Records abstracted including those from Care Everywhere.   What type of visit is this: consult  Reason for TOC/consult: preconception  Current Pregnancy problems: N/A  Genetic test results found on page: N/A  Documentation is complete per standard completion definition.   Missing information from Abstract includes:

## 2024-07-04 NOTE — Progress Notes (Signed)
 Assessment and Plan:     Assessment & Plan  Type 2 diabetes mellitus with other specified complication, without long-term current use of insulin (CMS-HCC)             Chronic obstructive pulmonary disease, unspecified COPD type    (CMS-HCC)        Orders:    SPIRIVA  RESPIMAT 2.5 mcg/actuation inhalation mist; Inhale 2 puffs in the morning.    Ambulatory referral to Pulmonology; Future    Moderate persistent asthma with acute exacerbation (HHS-HCC)             Gastroesophageal reflux disease without esophagitis    Orders:    omeprazole  (PRILOSEC) 40 MG capsule; Take 1 capsule (40 mg total) by mouth daily.    Seasonal allergic rhinitis, unspecified trigger    Orders:    cetirizine  (ZYRTEC ) 10 MG tablet; Take 1 tablet (10 mg total) by mouth daily.    Bipolar 1 disorder    (CMS-HCC)  Psychiatry through [facility]           Polysubstance dependence, non-opioid, in remission    (CMS-HCC)               Assessment & Plan  Type 2 diabetes mellitus  Blood glucose generally controlled with Ozempic . Recent weight loss noted, further loss desired before pregnancy.  - Continue Ozempic , increase to 1 mg dose.  - Follow-up in one month to reassess diabetes management and weight loss progress.    Obesity  - Continue Ozempic  for weight management.    Nicotine  dependence, in remission  Nicotine  dependence in remission with minimal cigarette use. On Chantix  for cessation.  - Continue Chantix  for smoking cessation.    Cocaine use disorder, in remission  Cocaine use disorder in remission over six months. Attending rehab, planning transition to step-down program.  - Continue rehab participation and transition to step-down program at [facility].    Gastroesophageal reflux disease (GERD)  GERD managed with omeprazole .  - Refilled omeprazole  prescription.    Allergic rhinitis  - Refilled cetirizine  prescription.    Asthma vs COPD  Managed with Symicort/Spiriva  and albuterol  as needed. Pulmonology referral needed.  - Refilled Spiriva  prescription.  - Referred to [facility] for further management.    Hidradenitis suppurativa  Well-controlled with doxycycline . Humira paused - has not needed to restart it, weight loss with ozempic  may assist with control  - Continue doxycycline .  - Monitor condition, consider dermatology referral if needed.    Osteoarthritis of both knees  Causing discomfort. Will follow up with ortho    General Health Maintenance  Routine health maintenance discussed, including vaccinations and prenatal vitamins.  - Administered flu and COVID vaccines.  - Prescribed prenatal vitamins.    Return in about 4 weeks (around 08/02/2024) for Recheck weight, diabetes.     Subjective:     Chief Complaint   Patient presents with    Follow-up       HPI: Susan Novak is a 43 y.o. female here for a follow up of diabetes  Lab Results   Component Value Date    A1C 6.7 (H) 04/07/2024        Reviewed recent Gyn history note - interested in future pregnancy:  Pertinent medical history: Hypertension, morbid obesity (BMI 60),  hidradenitis suppurativa, type 2 diabetes,  history of substance abuse (crack cocaine, currently living in [place] house).  Surgical history significant for D&C secondary to miscarriage.  She has 7 living children and is status post 7  NSVD.  She does have greater than 10-year pregnancy interval.  She recently quit smoking about 2 months ago with Chantix .       Last Pap 01/27/2024-NILM, HPV negative  Last mammogram 04/21/2024-BI-RADS 1, negative    History of Present Illness    Glycemic control and weight management  - Diabetes managed with Ozempic , resumed in May after a lapse due to substance use, restarted in rehab  - Completed 0.5 mg dose of Ozempic , has not yet started 1 mg dose - will start this week  - Home blood glucose levels range from 120 to 130 mg/dL  - Previous blood glucose spike to 210 mg/dL during rehab period without Ozempic   - Weight loss since restarting Ozempic , not much but has been on low dose    Hidradenitis suppurativa  - No significant flare of symptoms since restarting Ozempic   - Has not resumed Humira therapy  - Continues doxycycline  twice daily for symptom control    Substance use disorder and tobacco cessation  - History of cocaine addiction, completed six-month rehab program in [place], recently moved back to this area with husband and two sons  - Abstinent from cocaine for over six months  - Participating in step-down program at [facility] in [place]  - No cigarette use for almost three months, aided by Chantix     Chronic musculoskeletal pain and neuropathy  - Arthritis in both knees  - Nerve damage in both ankles  - Tendinitis, bone spurs, and issues with foot arches    Hx asthma vs COPD, would like referral to establish with [facility] pulmonology clinic    Social History[1]  - Living Situation: Lives with two sons and their father/stepfather; looking for a new place to live due to eviction. - Patient has older children who were adopted out; actively searching for one of them. Patient is in rehab and plans to start a step-down program ([facility]). Partner does not have addiction issues.  ROS:   Review of systems negative unless otherwise noted as per HPI.      Medications:   Current Medications[2]    Objective:     Vitals:    07/05/24 1256   BP: 126/80   Pulse: 99   Temp: 35.8 ??C (96.5 ??F)   SpO2: 99%   Weight: (!) 166.6 kg (367 lb 4.8 oz)   Height: 167.6 cm (5' 6)          07/05/24 1256   PainSc: 0-No pain     Body mass index is 59.28 kg/m??.      BP Readings from Last 3 Encounters:   07/05/24 126/80   06/22/24 135/90   06/02/24 135/75     Wt Readings from Last 3 Encounters:   07/05/24 (!) 166.6 kg (367 lb 4.8 oz)   06/22/24 (!) 167.3 kg (368 lb 14.4 oz)   06/02/24 (!) 166.9 kg (368 lb)       No results found for this visit on 07/05/24.      Physical Exam:  GEN: Well-developed, well nourished, in no acute distress  EYES: Sclera anicteric, conjunctivae clear.   NOSE: Normal nares  EARS: Normal form and location.  External canals clear.  TMs intact, gray, light reflex present.  MOUTH: Normal gums, mucosa, and palate. Good dentition. Pharynx clear.   NECK:  No palpable cervical lymphadenopathy, trachea midline, neck supple, no thyromegaly  CV: Regular rate and rhythm. Normal S1 and S2. No murmurs, gallops, or rubs, no carotid bruits.   RESP: Clear to  auscultation bilaterally without wheezes, rhonchi or crackles.   GI:  Soft, non-tender, and non-distended.   EXT: No lower extremity edema.   MSK: No acute deformity. Antalgic gait. Normal strength and tone in extremities.  SKIN: No rash or jaundice.  PSYCH: Mood and affect appropriate, normal insight and judgement.  NEURO: alert and oriented x3 and to situation. CN2-12 grossly intact.     [name], MD         [1]   Social History  Socioeconomic History    Marital status: Single     Spouse name: None    Number of children: None    Years of education: None    Highest education level: None   Tobacco Use    Smoking status: Light Smoker     Current packs/day: 0.30     Average packs/day: 0.3 packs/day for 28.1 years (8.4 ttl pk-yrs)     Types: Cigarettes     Start date: 06/09/1996     Passive exposure: Current    Smokeless tobacco: Never   Vaping Use    Vaping status: Some Days    Substances: Nicotine     Devices: Disposable   Substance and Sexual Activity    Alcohol use: No    Drug use: Yes     Frequency: 1.0 times per week     Types: Marijuana     Comment: I stopped everything for 6 years till recently for pain    Sexual activity: Not Currently     Partners: Male     Birth control/protection: I.U.D., Implant     Comment: Not currently having sex at all but the opertunity is there   Other Topics Concern    Exercise Yes     Comment: I try to exercise but it hurts so bad sometimes so i exercis    Living Situation Yes     Comment: Living situation is great     Social Drivers of Health     Food Insecurity: No Food Insecurity (04/06/2024)    Hunger Vital Sign     Worried About Running Out of Food in the Last Year: Never true     Ran Out of Food in the Last Year: Never true   Tobacco Use: High Risk (07/05/2024)    Patient History     Smoking Tobacco Use: Light Smoker     Smokeless Tobacco Use: Never     Passive Exposure: Current   Transportation Needs: No Transportation Needs (04/06/2024)    PRAPARE - Transportation     Lack of Transportation (Medical): No     Lack of Transportation (Non-Medical): No   Recent Concern: Transportation Needs - Unmet Transportation Needs (04/02/2024)    PRAPARE - Therapist, Art (Medical): Yes     Lack of Transportation (Non-Medical): Yes   Alcohol Use: Not At Risk (01/19/2023)    Alcohol Use     How often do you have a drink containing alcohol?: Never     How many drinks containing alcohol do you have on a typical day when you are drinking?: 1 - 2     How often do you have 5 or more drinks on one occasion?: Never   Housing: Low Risk (04/06/2024)    Housing     Within the past 12 months, have you ever stayed: outside, in a car, in a tent, in an overnight shelter, or temporarily in someone else's home (i.e. couch-surfing)?: No  Are you worried about losing your housing?: No   Recent Concern: Housing - High Risk (04/02/2024)    Housing     Within the past 12 months, have you ever stayed: outside, in a car, in a tent, in an overnight shelter, or temporarily in someone else's home (i.e. couch-surfing)?: Yes     Are you worried about losing your housing?: No   Physical Activity: Inactive (01/19/2023)    Exercise Vital Sign     Days of Exercise per Week: 0 days     Minutes of Exercise per Session: 0 min   Utilities: Low Risk (04/06/2024)    Utilities     Within the past 12 months, have you been unable to get utilities (heat, electricity) when it was really needed?: No   Recent Concern: Utilities - High Risk (04/02/2024)    Utilities     Within the past 12 months, have you been unable to get utilities (heat, electricity) when it was really needed?: Yes Stress: Stress Concern Present (01/19/2023)    [employer] of Occupational Health - Occupational Stress Questionnaire     Feeling of Stress : Very much   Interpersonal Safety: Not At Risk (04/06/2024)    Interpersonal Safety     Unsafe Where You Currently Live: No     Physically Hurt by Anyone: No     Abused by Anyone: No   Social Connections: Socially Isolated (01/19/2023)    Social Connection and Isolation Panel     Frequency of Communication with Friends and Family: More than three times a week     Frequency of Social Gatherings with Friends and Family: Never     Attends Religious Services: Never     Database Administrator or Organizations: No     Attends Banker Meetings: Never     Marital Status: Separated   Physicist, Medical Strain: High Risk (04/02/2024)    Overall Financial Resource Strain (CARDIA)     Difficulty of Paying Living Expenses: Hard   Health Literacy: Low Risk (01/19/2023)    Health Literacy     : Never   Internet Connectivity: Internet connectivity concern unknown (04/02/2024)    Internet Connectivity     Do you have access to internet services: Yes   [2]   Current Outpatient Medications:     blood sugar diagnostic (GLUCOSE BLOOD) Strp, by Other route two (2) times a day., Disp: 100 strip, Rfl: 1    doxycycline  (ADOXA) 100 MG tablet, Take 1 tablet (100 mg total) by mouth two (2) times a day., Disp: , Rfl:     EPINEPHrine (EPIPEN) 0.3 mg/0.3 mL injection, Please inject 0.3 mL into thigh in the case of severe SOB or anaphylaxis. Please report directly to the ED after administration., Disp: 0.3 mL, Rfl: 0    lancets Misc, Test 2 times daily, Disp: 100 each, Rfl: 11    leg brace (KNEE SUPPORT BRACE) Misc, Hinged Knee brace for knee stability, Disp: 2 each, Rfl: 0    levonorgestreL (MIRENA) 20 mcg/24 hours (6 yrs) 52 mg IUD, 1 each by Intrauterine route., Disp: , Rfl:     lurasidone (LATUDA) 20 mg Tab, Take 1 tablet (20 mg total) by mouth daily before dinner., Disp: 90 tablet, Rfl: 1    nortriptyline  (PAMELOR ) 10 MG capsule, Take 1 capsule (10 mg total) by mouth nightly., Disp: 90 capsule, Rfl: 1    semaglutide  (OZEMPIC ) 1 mg/dose (4 mg/3 mL) PnIj injection, Inject 1 mg under the  skin every seven (7) days., Disp: 3 mL, Rfl: 0    SYMBICORT  160-4.5 mcg/actuation inhaler, Inhale 2 puffs in the morning and 2 puffs before bedtime., Disp: 30.6 g, Rfl: 1    tinidazole  (TINDAMAX ) 500 MG tablet, Take 4 tablets PO daily for 2 days, Disp: 8 tablet, Rfl: 0    varenicline  tartrate (CHANTIX ) 1 mg tablet, Take 1 tablet (1 mg total) by mouth two (2) times a day., Disp: 60 tablet, Rfl: 2    albuterol  HFA 90 mcg/actuation inhaler, Inhale 2 puffs every six (6) hours as needed for wheezing., Disp: 18 g, Rfl: 0    blood-glucose meter kit, Use as directed, Disp: 1 each, Rfl: 0    cetirizine  (ZYRTEC ) 10 MG tablet, Take 1 tablet (10 mg total) by mouth daily., Disp: 30 tablet, Rfl: 11    multivitamin, prenatal, folic acid -iron , 29 mg iron - 1 mg, Take 1 tablet by mouth daily., Disp: 100 tablet, Rfl: 3    omeprazole  (PRILOSEC) 40 MG capsule, Take 1 capsule (40 mg total) by mouth daily., Disp: 100 capsule, Rfl: 3    SPIRIVA  RESPIMAT 2.5 mcg/actuation inhalation mist, Inhale 2 puffs in the morning., Disp: 4 g, Rfl: 11

## 2024-07-05 DIAGNOSIS — J302 Other seasonal allergic rhinitis: Principal | ICD-10-CM

## 2024-07-05 DIAGNOSIS — J449 Chronic obstructive pulmonary disease, unspecified: Principal | ICD-10-CM

## 2024-07-05 DIAGNOSIS — K219 Gastro-esophageal reflux disease without esophagitis: Principal | ICD-10-CM

## 2024-07-05 MED ORDER — OMEPRAZOLE 40 MG CAPSULE,DELAYED RELEASE
ORAL_CAPSULE | Freq: Every day | ORAL | 3 refills | 100.00000 days | Status: CP
Start: 2024-07-05 — End: ?

## 2024-07-05 MED ORDER — PRENATAL VITAMIN NO.76-IRON,CARBONYL 29 MG IRON-FOLIC ACID 1 MG TABLET
ORAL_TABLET | Freq: Every day | ORAL | 3 refills | 100.00000 days | Status: CP
Start: 2024-07-05 — End: ?

## 2024-07-05 MED ORDER — CETIRIZINE 10 MG TABLET
ORAL_TABLET | Freq: Every day | ORAL | 11 refills | 30.00000 days | Status: CP
Start: 2024-07-05 — End: ?

## 2024-07-05 MED ORDER — SPIRIVA RESPIMAT 2.5 MCG/ACTUATION SOLUTION FOR INHALATION
Freq: Every day | RESPIRATORY_TRACT | 11 refills | 0.00000 days | Status: CP
Start: 2024-07-05 — End: ?

## 2024-07-06 NOTE — Assessment & Plan Note (Signed)
 Psychiatry through [facility]

## 2024-07-06 NOTE — Assessment & Plan Note (Signed)
 Orders:    cetirizine (ZYRTEC) 10 MG tablet; Take 1 tablet (10 mg total) by mouth daily.

## 2024-07-06 NOTE — Assessment & Plan Note (Signed)
 Orders:    omeprazole (PRILOSEC) 40 MG capsule; Take 1 capsule (40 mg total) by mouth daily.

## 2024-07-15 ENCOUNTER — Ambulatory Visit: Admit: 2024-07-15 | Discharge: 2024-07-16 | Payer: Medicaid (Managed Care)

## 2024-07-15 DIAGNOSIS — Z3169 Encounter for other general counseling and advice on procreation: Principal | ICD-10-CM

## 2024-07-15 DIAGNOSIS — R102 Pelvic pain: Principal | ICD-10-CM

## 2024-07-15 MED ORDER — PRENATAL VITAMIN 27 MG IRON-0.8 MG TABLET
ORAL_TABLET | Freq: Every day | ORAL | 11 refills | 30.00000 days | Status: CP
Start: 2024-07-15 — End: 2025-07-10

## 2024-07-15 NOTE — Assessment & Plan Note (Addendum)
 Chronic R sided pelvic pain, intermittent for 11 years, none today. TVUS ordered. Follow up with scheduled MFM visit in January, or with [facility] sooner if needed.
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
## 2024-07-15 NOTE — Assessment & Plan Note (Signed)
 Medications reviewed. Recommended started prenatal vitamins, Rx sent. Discussed avoidance of smoking, EtOH, vape. Recommended timed intercourse with OPKs. Instructions given. Currently has follow up with MFM in January. Consider REI referral after 6 months of timed intercourse.

## 2024-07-26 ENCOUNTER — Ambulatory Visit
Admit: 2024-07-26 | Discharge: 2024-07-27 | Payer: Medicaid (Managed Care) | Attending: Rehabilitative and Restorative Service Providers" | Primary: Rehabilitative and Restorative Service Providers"

## 2024-07-26 NOTE — Progress Notes (Signed)
 Statement of Certifying Physician for Therapeutic Shoes     Patient Name: Susan Novak    MBI: [id number];    DOB:  02/03/1981      I certify that all of the following statements are true:     1. This patient has diabetes mellitus.     2. This patient has one or more of the following conditions. (Check all that apply):   []  History of partial or complete amputation of the foot   []  History of previous foot ulceration   []  History of pre-ulcerative callus   [x]  Peripheral neuropathy with evidence of callus formation   []  Foot deformity   []  Poor circulation     3. I am treating this patient under a comprehensive plan of care for his/her diabetes.     4. This patient needs special shoes (depth or custom-molded shoes) because of his/her diabetes.       Physician signature:  _________________________________________      Date Signed: _______________________________________________      Physician name (printed - MUST BE AN M.D. OR D.O.): Fleeta Dunning, Rocky Norris, MD     Physician Address:    [address] [place] / [place] [address]   [phone]     Physician NPI:  [id number]

## 2024-07-26 NOTE — Patient Instructions (Signed)
 Thank you for choosing [facility] for your prosthetic / orthotic needs.       Contact information:   The best method of contact is through [employer].  Using mychart, you can message your clinical team at any time and receive direct communications back to you.     To schedule a visit or for after hours care over the phone please contact:    [phone]     Making P&O devices can take time.  Your clinician will try to provide you with our best estimate of how long the process should take and if possible make an appointment for you to return.  Appointments help to set deadlines for production, but sometimes these need to be moved because of production delays.      In general the process follows these steps.   You meet with a clinician who recommends a device meant just for you   The clinician takes measurements or makes a detailed copy of your body to make your device   Our authorization team works on getting permission from your insurer to make you a device.     Our finance team lets you know how much you would be expected to pay and you choose to move forward.   Your device is ordered and fabrication begins   Your device is completed and arrives at our location   You return to be fit with your device    Here are some approximate timelines for common devices once production is approved to start  Device Expected timeline Visits Device Expected Timeline Visits   Scoliosis Brace 4 weeks 2 visits  +1 for in-brace x-ray Preparatory Prosthesis 2-3 weeks 2 visits   Cranial Remolding Orthosis 2 weeks 8 visits over 15 weeks Final Prosthesis 6 weeks 4-5 visits   AFO 3-4 weeks 2 visits Activity Specific Prosthesis 6-8 weeks 5-6 visits   Pre-made supplies, braces, etc 1 week 1-2 visits Aesthetic Restoration Prosthesis 8-12 weeks 2-3 visits     Payments:  [place] has a team of experts who will estimate how much you may owe for your orthosis.  If payment is due for a custom or special-order orthosis, a deposit will be required in order to begin work on the device.  Full payment will be expected at the time of delivery.  Payment plans can be arranged if that is helpful for you.  If you have questions about payments due you can contact the financial navigator team at [phone].          For some patients, it is not convenient to be seen in one of our locations.  If this is the case for you, please discuss it with your clinician who can coordinate your care with well trained clinical teams in your area.      We are committed to working to improve every day.  If you have feedback regarding our process or performance please contact the office manager at the site where you were seen or call the main number [phone].  Alternatively, you can send an email to [email or web address]  We are committed to confidentially addressing your feedback and resolving any issues in a timely manner.  We can receive faxes at [phone].
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
## 2024-08-11 NOTE — Progress Notes (Unsigned)
 Assessment and Plan:     Assessment & Plan      Assessment & Plan      No follow-ups on file.     Subjective:     No chief complaint on file.      HPI: Susan Novak is a 44 y.o. female here for a follow up  Since last visit has seen Ob/Gyn for pelvic pain - pelvic ultrasound/s ordered, not yet completed - scheduled 1/6  Pulmonology appt 1/22 scheduled  MFM visit scheduled in January    From my last visit:  Type 2 diabetes mellitus  Blood glucose generally controlled with Ozempic . Recent weight loss noted, further loss desired before pregnancy.  - Continue Ozempic , increase to 1 mg dose.  - Follow-up in one month to reassess diabetes management and weight loss progress.    Obesity  - Continue Ozempic  for weight management.     Nicotine  dependence, in remission  Nicotine  dependence in remission with minimal cigarette use. On Chantix  for cessation.  - Continue Chantix  for smoking cessation.     Cocaine use disorder, in remission  Cocaine use disorder in remission over six months. Attending rehab, planning transition to step-down program.  - Continue rehab participation and transition to step-down program at [facility].     Gastroesophageal reflux disease (GERD)  GERD managed with omeprazole .  - Refilled omeprazole  prescription.     Allergic rhinitis  - Refilled cetirizine  prescription.     Asthma vs COPD  Managed with Symicort/Spiriva  and albuterol  as needed. Pulmonology referral needed.  - Refilled Spiriva  prescription.  - Referred to [facility] for further management.     Hidradenitis suppurativa  Well-controlled with doxycycline . Humira paused - has not needed to restart it, weight loss with ozempic  may assist with control  - Continue doxycycline .  - Monitor condition, consider dermatology referral if needed.     Osteoarthritis of both knees  Causing discomfort. Will follow up with ortho     To do today:  - Follow up A1c, weight, smoking cessation/review upcoming appts    History of Present Illness      Social History[1]  - Living Situation: Lives with two sons and their father/stepfather; looking for a new place to live due to eviction. - Patient has older children who were adopted out; actively searching for one of them. Patient is in rehab and plans to start a step-down program ([facility]). Partner does not have addiction issues.   ROS:   Review of systems negative unless otherwise noted as per HPI.      Medications:   Current Medications[2]    Objective:     There were no vitals filed for this visit.  There were no vitals filed for this visit.  There is no height or weight on file to calculate BMI.      BP Readings from Last 3 Encounters:   07/15/24 131/61   07/05/24 126/80   06/22/24 135/90     Wt Readings from Last 3 Encounters:   07/15/24 (!) 166.9 kg (368 lb)   07/05/24 (!) 166.6 kg (367 lb 4.8 oz)   06/22/24 (!) 167.3 kg (368 lb 14.4 oz)       No results found for this visit on 08/12/24.      Physical Exam:  GEN: Well-developed, well nourished, in no acute distress  EYES: Sclera anicteric, conjunctivae clear.   NOSE: Normal nares  EARS: Normal form and location.  External canals clear.  TMs intact, gray, light reflex  present.  MOUTH: Normal gums, mucosa, and palate. Good dentition. Pharynx clear.   NECK:  No palpable cervical lymphadenopathy, trachea midline, neck supple, no thyromegaly  CV: Regular rate and rhythm. Normal S1 and S2. No murmurs, gallops, or rubs, no carotid bruits. Radial pulses are 2+ and symmetric.  RESP: Clear to auscultation bilaterally without wheezes, rhonchi or crackles.   GI: Normal abdominal bowel sounds. Soft, non-tender, and non-distended. No hepatosplenomegaly.  EXT: No lower extremity edema.   MSK: No acute deformity. Normal gait and station. Normal strength and tone in extremities.  SKIN: No rash or jaundice.  PSYCH: Mood and affect appropriate, normal insight and judgement.  NEURO: alert and oriented x3 and to situation. CN2-12 grossly intact. Patellar reflexes 2+ Susan FORBES Fleeta Joelene, MD         [1]   Social History  Socioeconomic History    Marital status: Single   Tobacco Use    Smoking status: Light Smoker     Current packs/day: 0.30     Average packs/day: 0.3 packs/day for 28.2 years (8.5 ttl pk-yrs)     Types: Cigarettes     Start date: 06/09/1996     Passive exposure: Current    Smokeless tobacco: Never   Vaping Use    Vaping status: Some Days    Substances: Nicotine     Devices: Disposable   Substance and Sexual Activity    Alcohol use: No    Drug use: Yes     Frequency: 1.0 times per week     Types: Marijuana     Comment: I stopped everything for 6 years till recently for pain    Sexual activity: Not Currently     Partners: Male     Birth control/protection: I.U.D., Implant     Comment: Not currently having sex at all but the opertunity is there   Other Topics Concern    Exercise Yes     Comment: I try to exercise but it hurts so bad sometimes so i exercis    Living Situation Yes     Comment: Living situation is great     Social Drivers of Health     Food Insecurity: No Food Insecurity (04/06/2024)    Hunger Vital Sign     Worried About Running Out of Food in the Last Year: Never true     Ran Out of Food in the Last Year: Never true   Tobacco Use: High Risk (07/15/2024)    Patient History     Smoking Tobacco Use: Light Smoker     Smokeless Tobacco Use: Never     Passive Exposure: Current   Transportation Needs: No Transportation Needs (04/06/2024)    PRAPARE - Transportation     Lack of Transportation (Medical): No     Lack of Transportation (Non-Medical): No   Recent Concern: Transportation Needs - Unmet Transportation Needs (04/02/2024)    PRAPARE - Therapist, Art (Medical): Yes     Lack of Transportation (Non-Medical): Yes   Alcohol Use: Not At Risk (01/19/2023)    Alcohol Use     How often do you have a drink containing alcohol?: Never     How many drinks containing alcohol do you have on a typical day when you are drinking?: 1 - 2     How often do you have 5 or more drinks on one occasion?: Never   Housing: Low Risk (04/06/2024)    Housing     Within  the past 12 months, have you ever stayed: outside, in a car, in a tent, in an overnight shelter, or temporarily in someone else's home (i.e. couch-surfing)?: No     Are you worried about losing your housing?: No   Recent Concern: Housing - High Risk (04/02/2024)    Housing     Within the past 12 months, have you ever stayed: outside, in a car, in a tent, in an overnight shelter, or temporarily in someone else's home (i.e. couch-surfing)?: Yes     Are you worried about losing your housing?: No   Physical Activity: Inactive (01/19/2023)    Exercise Vital Sign     Days of Exercise per Week: 0 days     Minutes of Exercise per Session: 0 min   Utilities: Low Risk (04/06/2024)    Utilities     Within the past 12 months, have you been unable to get utilities (heat, electricity) when it was really needed?: No   Recent Concern: Utilities - High Risk (04/02/2024)    Utilities     Within the past 12 months, have you been unable to get utilities (heat, electricity) when it was really needed?: Yes   Stress: Stress Concern Present (01/19/2023)    [employer] of Occupational Health - Occupational Stress Questionnaire     Feeling of Stress : Very much   Interpersonal Safety: Not At Risk (04/06/2024)    Interpersonal Safety     Unsafe Where You Currently Live: No     Physically Hurt by Anyone: No     Abused by Anyone: No   Social Connections: Socially Isolated (01/19/2023)    Social Connection and Isolation Panel     Frequency of Communication with Friends and Family: More than three times a week     Frequency of Social Gatherings with Friends and Family: Never     Attends Religious Services: Never     Database Administrator or Organizations: No     Attends Banker Meetings: Never     Marital Status: Separated   Physicist, Medical Strain: High Risk (04/02/2024)    Overall Financial Resource Strain (CARDIA) Difficulty of Paying Living Expenses: Hard   Health Literacy: Low Risk (01/19/2023)    Health Literacy     : Never   Internet Connectivity: Internet connectivity concern unknown (04/02/2024)    Internet Connectivity     Do you have access to internet services: Yes   [2]   Current Outpatient Medications:     albuterol  HFA 90 mcg/actuation inhaler, Inhale 2 puffs every six (6) hours as needed for wheezing., Disp: 18 g, Rfl: 0    blood sugar diagnostic (GLUCOSE BLOOD) Strp, by Other route two (2) times a day., Disp: 100 strip, Rfl: 1    blood-glucose meter kit, Use as directed, Disp: 1 each, Rfl: 0    cetirizine  (ZYRTEC ) 10 MG tablet, Take 1 tablet (10 mg total) by mouth daily., Disp: 30 tablet, Rfl: 11    doxycycline  (ADOXA) 100 MG tablet, Take 1 tablet (100 mg total) by mouth two (2) times a day., Disp: , Rfl:     EPINEPHrine (EPIPEN) 0.3 mg/0.3 mL injection, Please inject 0.3 mL into thigh in the case of severe SOB or anaphylaxis. Please report directly to the ED after administration., Disp: 0.3 mL, Rfl: 0    lancets Misc, Test 2 times daily, Disp: 100 each, Rfl: 11    leg brace (KNEE SUPPORT BRACE) Misc, Hinged Knee brace for knee stability,  Disp: 2 each, Rfl: 0    levonorgestreL (MIRENA) 20 mcg/24 hours (6 yrs) 52 mg IUD, 1 each by Intrauterine route., Disp: , Rfl:     lurasidone (LATUDA) 20 mg Tab, Take 1 tablet (20 mg total) by mouth daily before dinner., Disp: 90 tablet, Rfl: 1    nortriptyline  (PAMELOR ) 10 MG capsule, Take 1 capsule (10 mg total) by mouth nightly., Disp: 90 capsule, Rfl: 1    omeprazole  (PRILOSEC) 40 MG capsule, Take 1 capsule (40 mg total) by mouth daily., Disp: 100 capsule, Rfl: 3    prenatal vit no.130-iron -folic (PRENATAL VITAMIN) 27 mg iron - 800 mcg Tab tablet, Take 1 tablet by mouth daily., Disp: 30 tablet, Rfl: 11    semaglutide  (OZEMPIC ) 1 mg/dose (4 mg/3 mL) PnIj injection, Inject 1 mg under the skin every seven (7) days., Disp: 3 mL, Rfl: 0    SPIRIVA  RESPIMAT 2.5 mcg/actuation inhalation mist, Inhale 2 puffs in the morning., Disp: 4 g, Rfl: 11    SYMBICORT  160-4.5 mcg/actuation inhaler, Inhale 2 puffs in the morning and 2 puffs before bedtime., Disp: 30.6 g, Rfl: 1    tinidazole  (TINDAMAX ) 500 MG tablet, Take 4 tablets PO daily for 2 days, Disp: 8 tablet, Rfl: 0

## 2024-09-06 DIAGNOSIS — Z3169 Encounter for other general counseling and advice on procreation: Principal | ICD-10-CM
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
## 2024-09-06 NOTE — Assessment & Plan Note (Addendum)
 Overall Assessment  This patient presents for preconception counseling with multiple severe, currently suboptimally controlled medical conditions that substantially increase the risk of maternal morbidity and mortality, including cardiopulmonary compromise, hypertensive disorders, severe metabolic complications, preterm birth, fetal growth abnormalities, stillbirth, and operative delivery complications. Her most concerning risks relate to morbid obesity (BMI 59), suspected chronic lung disease with active tobacco use, untreated obstructive sleep apnea, type 2 diabetes, and advanced maternal age (44). At this time, pregnancy is not recommended. The patient was counseled that conception should be deferred until significant medical optimization is achieved. In the event that Ms. Callander elects to pursue pregnancy, the patient has been counseled extensively on the heightened risks of maternal morbidity and mortality attributable to her chronic comorbidities and advanced maternal age 44yo, and she affirms understanding and acceptance of these risks.     Problem-Based Assessment & Plan    # Class III Obesity (BMI 59)  Assessment:  Extremely high-risk BMI associated with anesthesia complications, cardiopulmonary compromise, thromboembolism, preeclampsia, stillbirth, wound complications, and ICU-level maternal morbidity.  Plan:  Strongly recommend deferring pregnancy  Revisit bariatric surgery evaluation; pulmonary status must be assessed and optimized first  Continue medical weight management  Ozempic  (semaglutide ) must be discontinued >=2 months before conception  Even 5-10% weight loss improves risk  Ambulatory referral to nutrition services was placed.    # Chronic Hypertension (likely undiagnosed)  Assessment:  Repeated BPs in hypertensive range; untreated HTN markedly increases maternal stroke, heart failure, placental abruption, fetal growth restriction, and stillbirth.  Plan:  Formal diagnosis and management by PCP/cardiology  Baseline CMP, urine protein/Cr, echocardiogram, EKG  Optimize BP before pregnancy  If pregnant in future: aspirin prophylaxis 162mg     # Type 2 Diabetes  Assessment:  Pregestational diabetes with suboptimal monitoring. Poor glycemic control increases risk of congenital anomalies, miscarriage, preeclampsia, and stillbirth.  Plan:  Defer pregnancy until A1c <6.5%  Recommend endocrinology referral  Regular glucose monitoring  Transition off GLP-1 prior to conception    # Suspected OSA  Assessment:  Untreated OSA increases risk of severe preeclampsia, cardiomyopathy, and anesthesia complications.  Plan:  Sleep study urgently  CPAP if diagnosed  Optimize before pregnancy    # Asthma / Possible COPD  Assessment:  Concern for chronic lung disease with recent exacerbations and no records of PFTs. Pregnancy increases oxygen demand; untreated pulmonary disease raises risk of respiratory failure.  Plan:  Pulmonary consultation  Pulmonary function testing  Smoking cessation  Vaccination review (flu, pneumococcal)    # Current Tobacco Use  Assessment:  Major contributor to fetal growth restriction, placental abruption, stillbirth, and maternal cardiopulmonary disease.  Plan:  Strong counseling to stop smoking completely before pregnancy  Continue Chantix  under PCP supervision  Behavioral support    # Advanced Maternal Age (44)  Assessment:  High risk of aneuploidy, miscarriage, hypertensive disease, stillbirth.  Plan:  Genetic counseling when medically optimized    # Dyslipidemia  Plan:  Lifestyle management; statins not recommended in pregnancy unless severe and significant family history    # Bipolar Disorder / Anxiety  Assessment:  Untreated psychiatric illness increases relapse and postpartum complications.  Plan:  Psychiatry follow-up  Resume Latuda under supervision  Stabilize mental health prior to pregnancy    # History of Substance Use  Plan:  Maintain sobriety support; pregnancy not advised until stability maintained    # Routine Preconception Care  Plan:  Prenatal vitamin  Vaccinations  Medication review  Optimize comorbidities prior to conception    Summary  for Referring Provider  Pregnancy is not recommended at this time. Key priorities before reconsidering conception:  Significant weight reduction ?? bariatric surgery  Discontinue Ozempic  >/=2 months prior to trying to conceive  Complete smoking cessation  Pulmonary evaluation with PFTs and OSA treatment  Optimize diabetes (A1c <6.5%)  Treat chronic hypertension  Stabilize psychiatric care  These steps are required to reduce life-threatening maternal risk.    Key References  ACOG Practice Bulletin: Obesity in Pregnancy  ACOG PB: Chronic Hypertension  ADA Standards of Care - Pregnancy  [facility] Consult Series: Pulmonary Disease in Pregnancy  ACOG Committee Opinion: Tobacco Use  ACOG PB: Advanced Maternal Age

## 2024-09-08 NOTE — Progress Notes (Unsigned)
 Spoke with patient she states that she had an referral for both pulmonologist and podiatry that were sent to [facility].    Patient is now in [place] please send referral to     [facility] pulmonary care at [facility] and [facility] at [facility]

## 2024-09-12 NOTE — Addendum Note (Signed)
 Addended by: VAN SCOYOC, Clare Fennimore on: 09/12/2024 11:07 AM     Modules accepted: Orders
